# Patient Record
Sex: Female | Born: 1955 | Race: Black or African American | Hispanic: No | Marital: Married | Smoking: Never smoker
Health system: Southern US, Community
[De-identification: ages and names within clinical notes are randomized; demographics above are authoritative.]

## PROBLEM LIST (undated history)

## (undated) DIAGNOSIS — N289 Disorder of kidney and ureter, unspecified: Secondary | ICD-10-CM

## (undated) DIAGNOSIS — E7800 Pure hypercholesterolemia, unspecified: Secondary | ICD-10-CM

## (undated) DIAGNOSIS — E785 Hyperlipidemia, unspecified: Secondary | ICD-10-CM

## (undated) DIAGNOSIS — L409 Psoriasis, unspecified: Secondary | ICD-10-CM

## (undated) DIAGNOSIS — G8334 Monoplegia, unspecified affecting left nondominant side: Secondary | ICD-10-CM

## (undated) DIAGNOSIS — K589 Irritable bowel syndrome without diarrhea: Secondary | ICD-10-CM

## (undated) DIAGNOSIS — E559 Vitamin D deficiency, unspecified: Secondary | ICD-10-CM

## (undated) DIAGNOSIS — E538 Deficiency of other specified B group vitamins: Secondary | ICD-10-CM

## (undated) DIAGNOSIS — I10 Essential (primary) hypertension: Secondary | ICD-10-CM

## (undated) HISTORY — DX: Psoriasis, unspecified: L40.9

## (undated) HISTORY — DX: Irritable bowel syndrome, unspecified: K58.9

## (undated) HISTORY — DX: Hyperlipidemia, unspecified: E78.5

## (undated) HISTORY — DX: Pure hypercholesterolemia, unspecified: E78.00

## (undated) HISTORY — DX: Deficiency of other specified B group vitamins: E53.8

## (undated) HISTORY — DX: Vitamin D deficiency, unspecified: E55.9

## (undated) HISTORY — DX: Essential (primary) hypertension: I10

## (undated) HISTORY — DX: Monoplegia, unspecified affecting left nondominant side: G83.34

---

## 2021-04-01 ENCOUNTER — Other Ambulatory Visit: Payer: Self-pay | Admitting: Neurology

## 2021-04-01 DIAGNOSIS — R202 Paresthesia of skin: Secondary | ICD-10-CM

## 2021-04-08 ENCOUNTER — Other Ambulatory Visit: Payer: Self-pay | Admitting: Neurology

## 2021-04-08 DIAGNOSIS — R202 Paresthesia of skin: Secondary | ICD-10-CM

## 2021-04-28 ENCOUNTER — Ambulatory Visit
Admission: RE | Admit: 2021-04-28 | Discharge: 2021-04-28 | Disposition: A | Discharge status: Home / Self Care | Payer: Medicare PPO | Source: Ambulatory Visit | Attending: Neurology | Admitting: Neurology

## 2021-04-28 DIAGNOSIS — R202 Paresthesia of skin: Secondary | ICD-10-CM

## 2021-04-28 IMAGING — MR MR HEAD WO/W CM
12 series · 48 of 48 positions shown · IV contrast (multihance)
Comparison: Same day cervical spine MRI [DATE].

CLINICAL DATA: Provided history: Paresthesia. Additional history
provided by scanning technologist: Patient reports piercing
headaches, nausea, involuntary movement, "skin crawling" sensation,
head neck pain, left-sided numbness, weakness of entire body,
symptoms for 1 year.

EXAM:
MRI HEAD WITHOUT AND WITH CONTRAST
TECHNIQUE: Multiplanar, multiecho pulse sequences of the brain and surrounding
structures were obtained without and with intravenous contrast.
CONTRAST:  15mL MULTIHANCE GADOBENATE DIMEGLUMINE 529 MG/ML IV SOLN

[Series 2: T1 · sagittal · 5.0mm · 0.45mm/px · 1 of 25 slices shown]
[im 1/25]
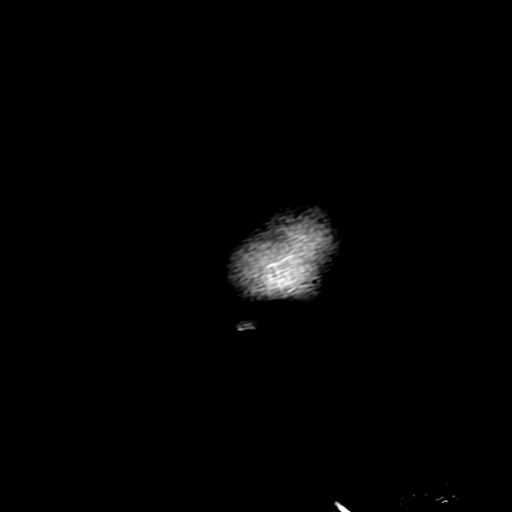

[Series 3: ax ep2d_diff_3 · axial · 3.0mm · 1.80mm/px · z∈[-99,+54]mm · 5 of 104 slices shown]
[im 1/104]
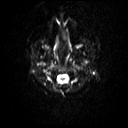
[im 26/104]
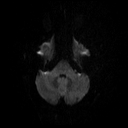
[im 52/104]
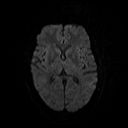
[im 78/104]
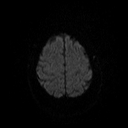
[im 104/104]
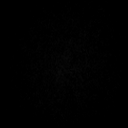

[Series 4: ax ep2d_diff_3_adc · axial · 3.0mm · 1.80mm/px · z∈[-99,+54]mm · 3 of 52 slices shown]
[im 1/52]
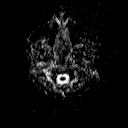
[im 26/52]
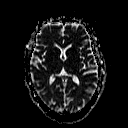
[im 52/52]
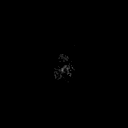

[Series 5: cor ep2d_diff · coronal · 5.0mm · 1.77mm/px · 4 of 60 slices shown]
[im 1/60]
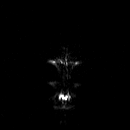
[im 20/60]
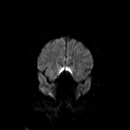
[im 40/60]
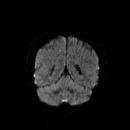
[im 60/60]
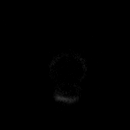

[Series 6: cor ep2d_diff_adc · coronal · 5.0mm · 1.77mm/px · 2 of 30 slices shown]
[im 1/30]
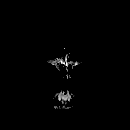
[im 30/30]
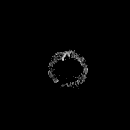

[Series 8: swi_images · axial · 2.0mm · 0.98mm/px · z∈[-102,+56]mm · 5 of 80 slices shown]
[im 1/80]
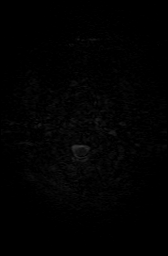
[im 20/80]
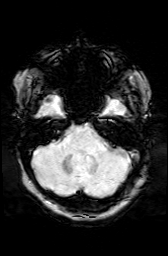
[im 40/80]
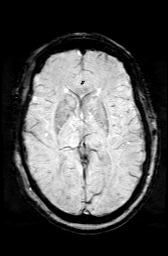
[im 60/80]
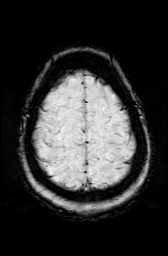
[im 80/80]
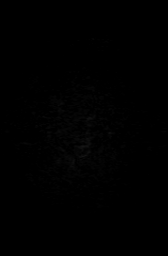

[Series 9: FLAIR · axial · 3.0mm · 0.43mm/px · z∈[-101,+51]mm · 2 of 40 slices shown]
[im 1/40]
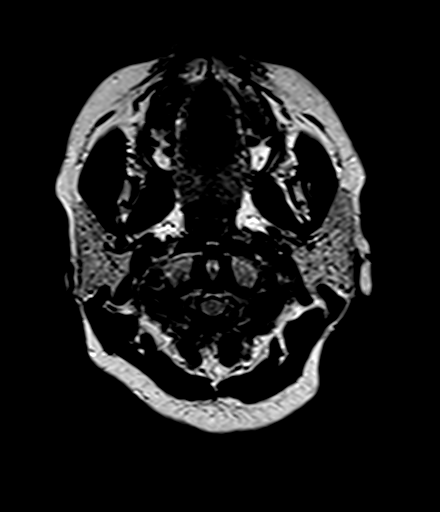
[im 40/40]
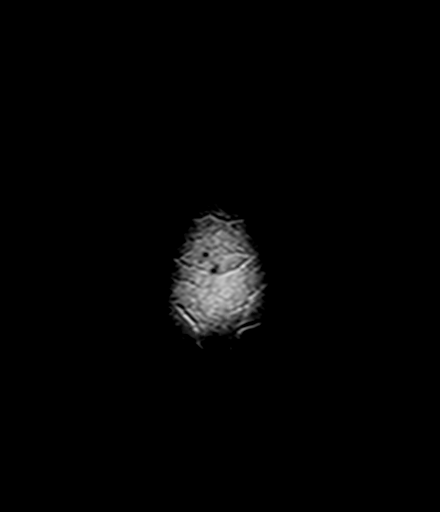

[Series 10: T2 · axial · 5.0mm · 0.65mm/px · z∈[-100,+50]mm · 2 of 26 slices shown (1 of 2)]
[im 1/26]
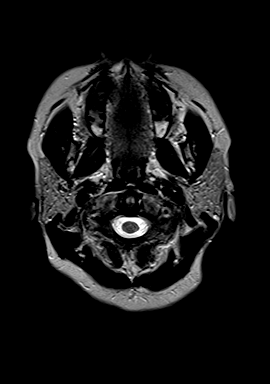
[im 26/26]
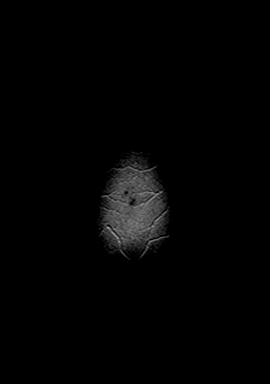

[Series 11: t1_mpr_tra · axial · 1.0mm · 0.72mm/px · z∈[-101,+58]mm · 10 of 160 slices shown]
[im 1/160]
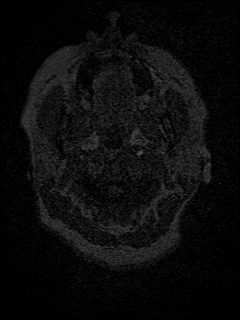
[im 18/160]
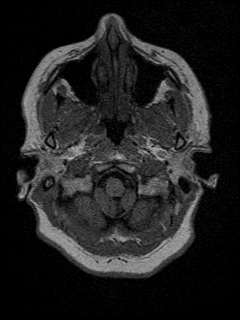
[im 36/160]
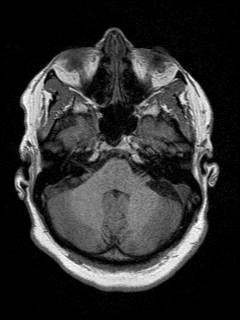
[im 54/160]
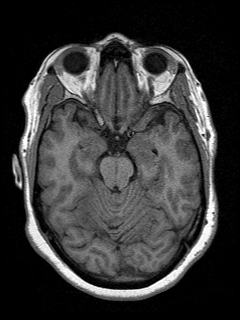
[im 71/160]
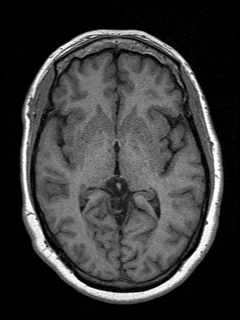
[im 89/160]
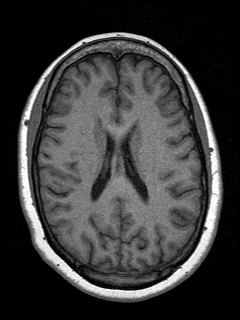
[im 107/160]
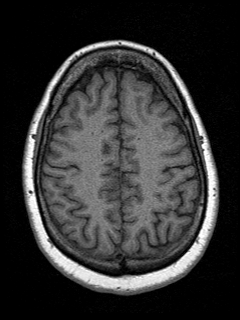
[im 124/160]
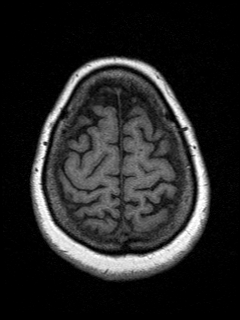
[im 142/160]
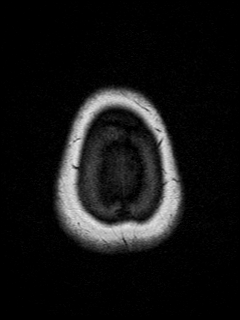
[im 160/160]
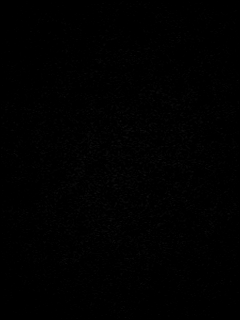

[Series 12: T2 · coronal · 5.0mm · 0.43mm/px · 2 of 31 slices shown (2 of 2)]
[im 1/31]
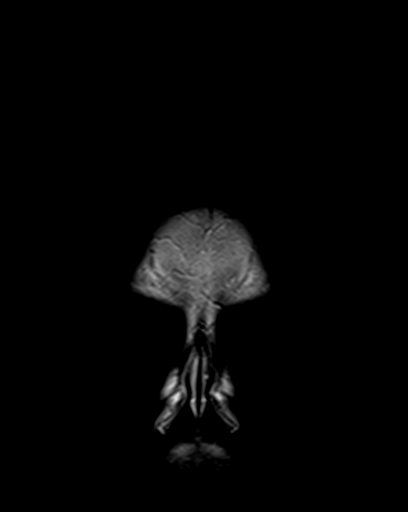
[im 31/31]
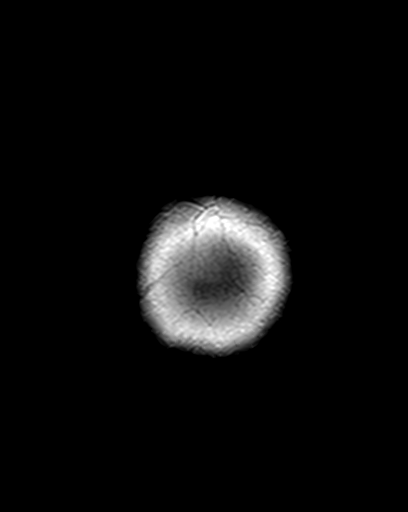

[Series 13: post t1_mpr_tra · axial · 1.0mm · 0.72mm/px · z∈[-101,+58]mm · 10 of 160 slices shown]
[im 1/160]
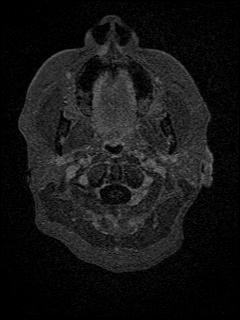
[im 18/160]
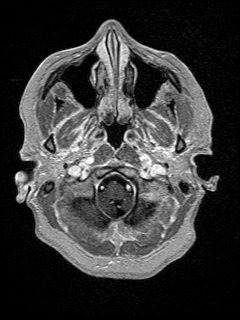
[im 36/160]
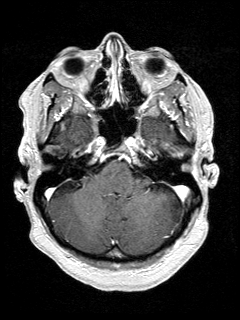
[im 54/160]
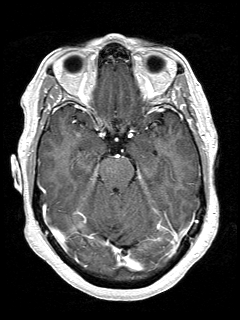
[im 71/160]
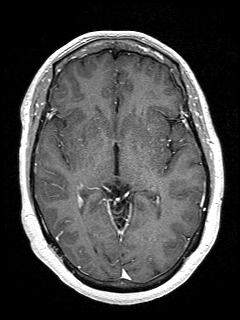
[im 89/160]
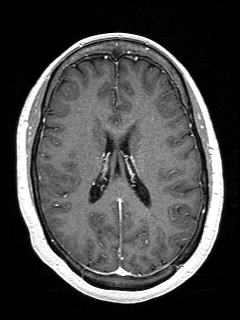
[im 107/160]
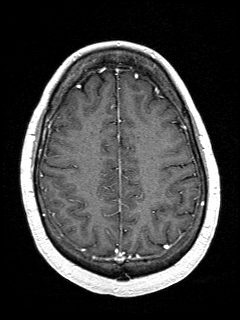
[im 124/160]
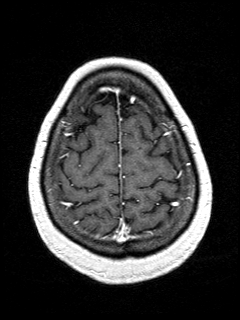
[im 142/160]
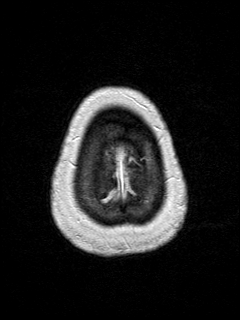
[im 160/160]
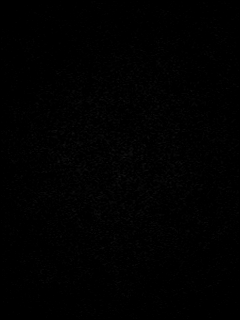

[Series 14: T1 post-contrast · coronal · 5.0mm · 0.72mm/px · 2 of 31 slices shown]
[im 1/31]
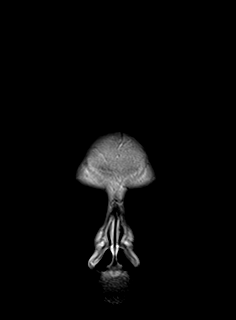
[im 31/31]
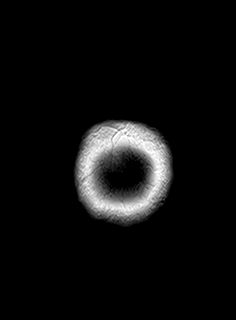

[48 of 48 positions shown; findings below may reference images not displayed]

FINDINGS: Brain:

Cerebral volume is normal.

There are a few scattered punctate foci of T2 FLAIR hyperintense
signal abnormality within the cerebral white matter, nonspecific but
likely reflecting minimal changes of chronic small vessel ischemia.

There is no acute infarct.

No evidence of an intracranial mass.

No chronic intracranial blood products.

No extra-axial fluid collection.

No midline shift.

No pathologic intracranial enhancement identified.

Vascular: Maintained flow voids within the proximal large arterial
vessels.

Skull and upper cervical spine: No focal suspicious marrow lesion.

Sinuses/Orbits: Visualized orbits show no acute finding. No
significant paranasal sinus disease.
IMPRESSION: No evidence of acute intracranial abnormality.

There are a few scattered punctate foci of T2 FLAIR hyperintense
signal abnormality within the cerebral white matter, nonspecific but
likely reflecting minimal changes of chronic small vessel ischemia.

Otherwise unremarkable MRI appearance of the brain.

## 2021-04-28 IMAGING — MR MR CERVICAL SPINE WO/W CM
6 of 8 series · 29 of 48 positions shown · IV contrast (15 ml Multihance)
Comparison: Same-day brain MRI [DATE].

CLINICAL DATA: Provided history: Paresthesia. Additional history
provided by scanning technologist: Patient reports piercing
headaches, nausea, involuntary movement, "skin crawling" sensation,
head and neck pain, left-sided numbness, weakness of entire body,
symptoms for 1 year.

EXAM:
MRI CERVICAL SPINE WITHOUT AND WITH CONTRAST
TECHNIQUE: Multiplanar and multiecho pulse sequences of the cervical spine, to
include the craniocervical junction and cervicothoracic junction,
were obtained without and with intravenous contrast.
CONTRAST:  15mL MULTIHANCE GADOBENATE DIMEGLUMINE 529 MG/ML IV SOLN

[Series 3: T1 · sagittal · 3.0mm · 0.41mm/px · 4 of 17 slices shown (1 of 2)]
[im 1/17]
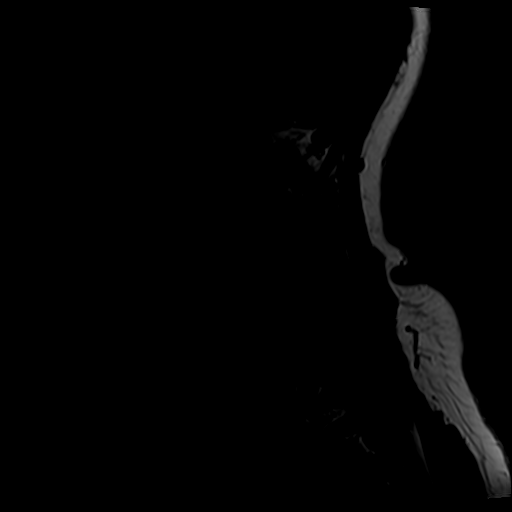
[im 6/17]
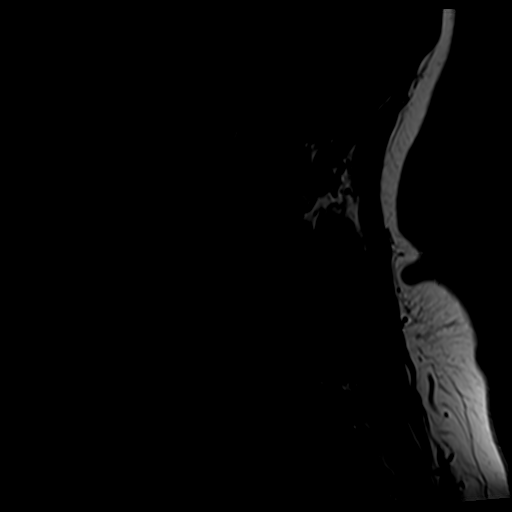
[im 11/17]
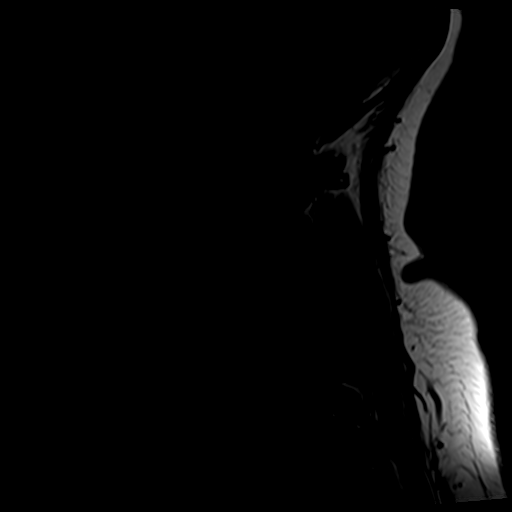
[im 17/17]
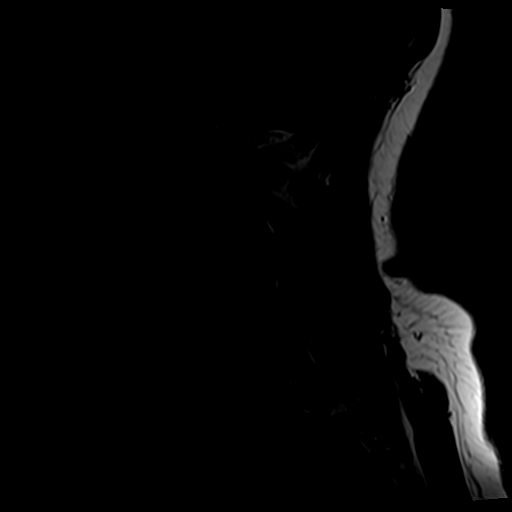

[Series 5: T2 · axial · 3.0mm · 0.70mm/px · z∈[-218,-99]mm · 8 of 33 slices shown (1 of 2)]
[im 1/33]
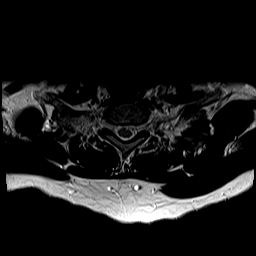
[im 5/33]
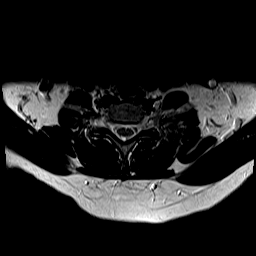
[im 10/33]
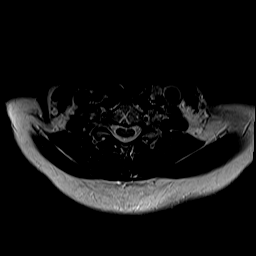
[im 14/33]
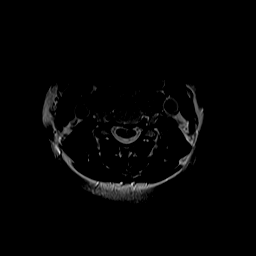
[im 19/33]
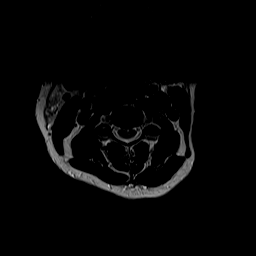
[im 23/33]
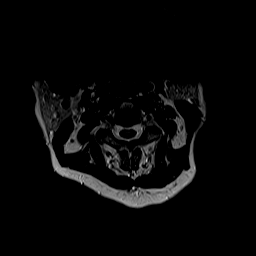
[im 28/33]
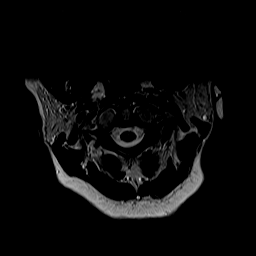
[im 33/33]
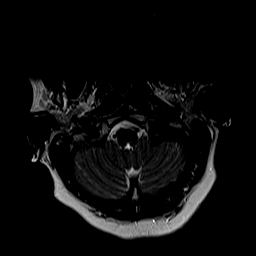

[Series 7: T1 · axial · non-contrast · 3.0mm · 0.35mm/px · z∈[-218,-99]mm · 8 of 33 slices shown (2 of 2)]
[im 1/33]
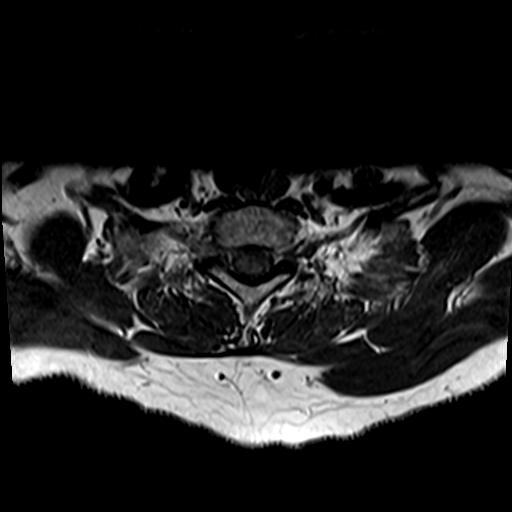
[im 5/33]
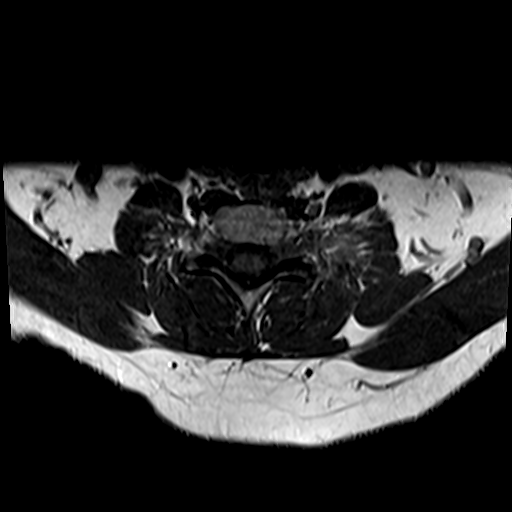
[im 10/33]
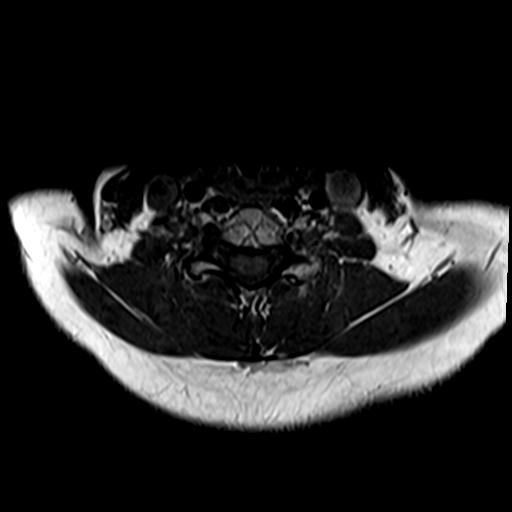
[im 14/33]
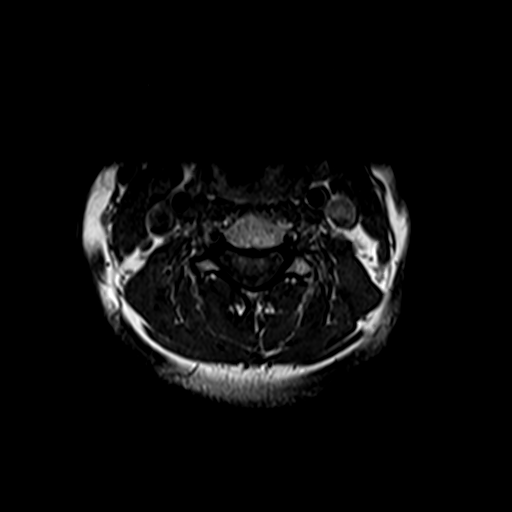
[im 19/33]
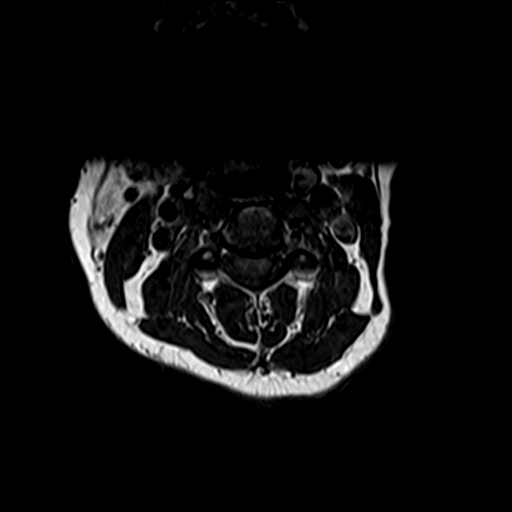
[im 23/33]
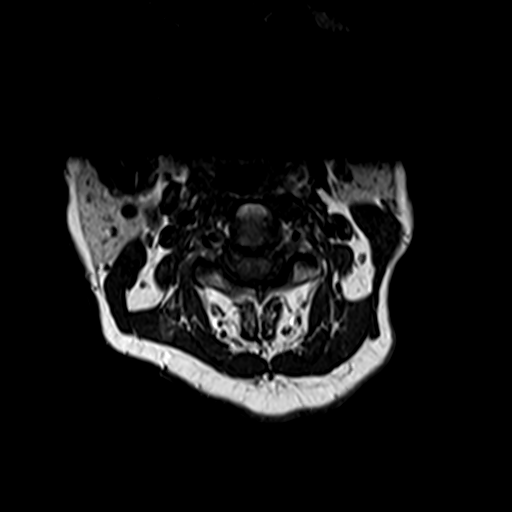
[im 28/33]
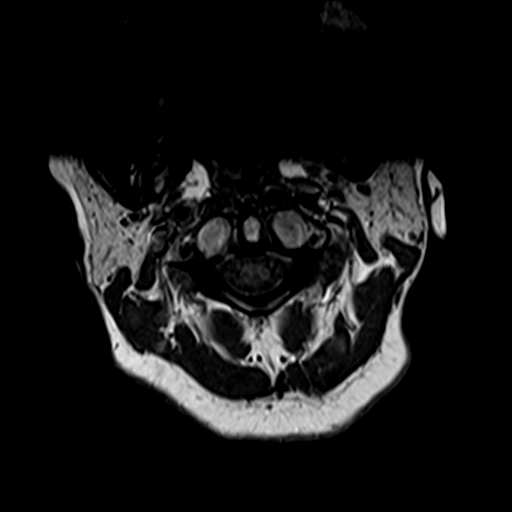
[im 33/33]
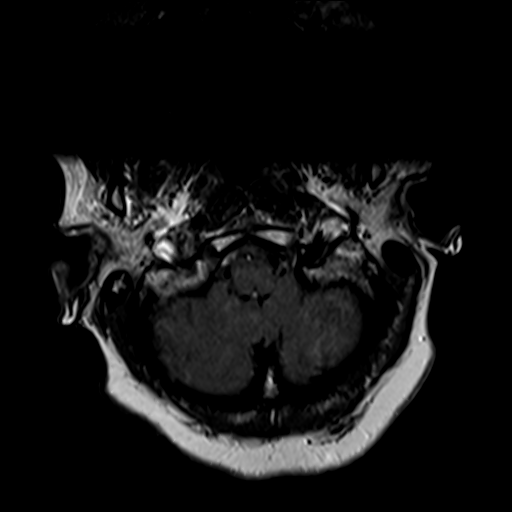

[Series 8: T2 · sagittal · 3.0mm · 0.82mm/px · 4 of 17 slices shown (2 of 2)]
[im 1/17]
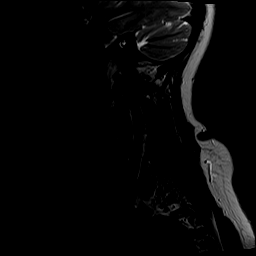
[im 6/17]
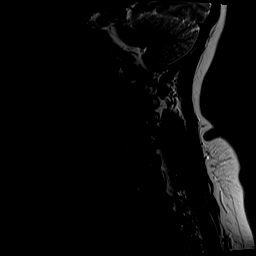
[im 11/17]
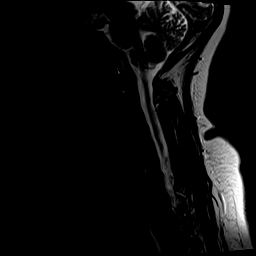
[im 17/17]
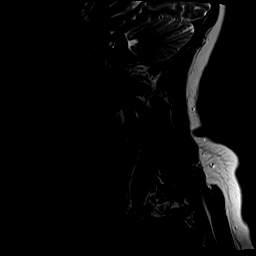

[Series 9: T1 fat-sat post-contrast · sagittal · 3.0mm · 0.82mm/px · 4 of 17 slices shown]
[im 1/17]
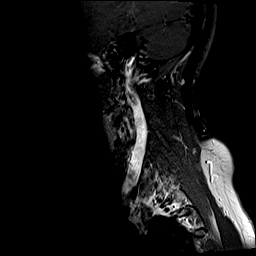
[im 6/17]
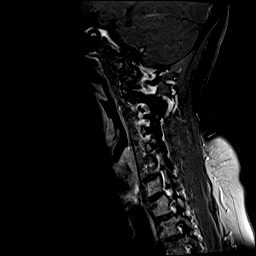
[im 11/17]
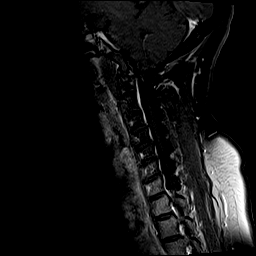
[im 17/17]
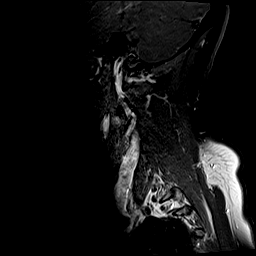

[Series 10: T1 post-contrast · axial · 3.0mm · 0.35mm/px · 1 of 33 slices shown]
[im 1/33]
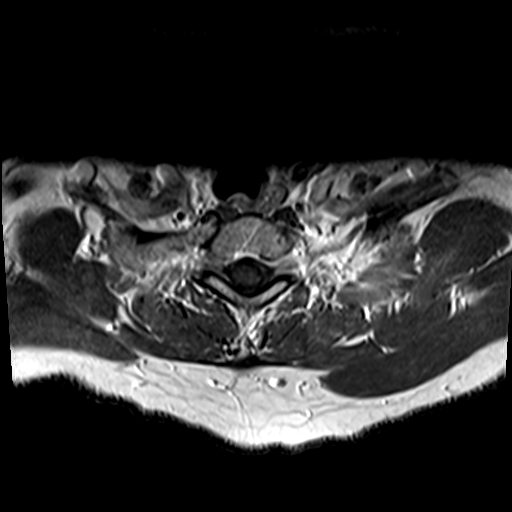

[29 of 48 positions shown; findings below may reference images not displayed]

FINDINGS: Alignment: Straightening of the expected cervical lordosis. No
significant spondylolisthesis.

Vertebrae: Vertebral body height is maintained. No significant
marrow edema or focal suspicious osseous lesion.

Cord: No signal abnormality identified within the cervical spinal
cord. No abnormal spinal cord enhancement.

Posterior Fossa, vertebral arteries, paraspinal tissues: Posterior
fossa better assessed on concurrently performed brain MRI. Flow
voids preserved within the imaged cervical vertebral arteries.
Paraspinal soft tissues unremarkable.

Disc levels:

Mild-to-moderate disc degeneration at C4-C5, C5-C6 and C6-C7. No
more than mild disc degeneration at the remaining levels.

Borderline congenitally narrow cervical spinal canal.

C2-C3: No significant disc herniation or stenosis.

C3-C4: Small central disc protrusion. The disc protrusion results in
mild focal effacement of the ventral thecal sac, and may contact the
ventral spinal cord. No significant foraminal stenosis.

C4-C5: Shallow disc bulge with superimposed small left center disc
protrusion. Minimal uncovertebral hypertrophy on the right. The disc
protrusion results in mild focal effacement of the left ventral
thecal sac, and may contact the ventral spinal cord. Minimal
relative right neural foraminal narrowing.

C5-C6: Shallow disc bulge with superimposed small center disc
protrusion. The disc protrusion focally effaces the ventral thecal
sac, contacting and minimally flattening the ventral aspect of the
spinal cord. However, the dorsal CSF space is maintained within the
spinal canal. No significant foraminal stenosis.

C6-C7: Disc bulge with superimposed small central disc protrusion.
The disc protrusion results in mild focal effacement of the ventral
thecal sac, and may contact the ventral spinal cord. No significant
foraminal stenosis.

C7-T1: No significant disc herniation or stenosis.
IMPRESSION: Cervical spondylosis, as outlined.

At C5-C6, a small central disc protrusion contacts and minimally
flattens the ventral aspect of the spinal cord. However, the dorsal
CSF space is maintained within the spinal canal.

No more than mild relative spinal canal narrowing at the remaining
levels. Minimal relative right neural foraminal narrowing at C4-C5.

Disc degeneration is greatest at C4-C5, C5-C6 and C6-C7
(mild-to-moderate at these levels).

Straightening of the expected cervical lordosis.

## 2021-04-28 MED ORDER — GADOBENATE DIMEGLUMINE 529 MG/ML IV SOLN
15.0000 mL | Freq: Once | INTRAVENOUS | Status: AC | PRN
  Administered 2021-04-28: 15 mL via INTRAVENOUS

## 2021-06-09 ENCOUNTER — Ambulatory Visit: Payer: Medicare PPO | Admitting: Neurology

## 2021-06-09 ENCOUNTER — Other Ambulatory Visit: Payer: Self-pay

## 2021-06-09 ENCOUNTER — Encounter: Payer: Self-pay | Admitting: Neurology

## 2021-06-09 VITALS — BP 122/82 | HR 70 | Ht 62.5 in | Wt 166.5 lb

## 2021-06-09 DIAGNOSIS — M5090 Cervical disc disorder, unspecified, unspecified cervical region: Secondary | ICD-10-CM

## 2021-06-09 DIAGNOSIS — R208 Other disturbances of skin sensation: Secondary | ICD-10-CM

## 2021-06-09 DIAGNOSIS — M79602 Pain in left arm: Secondary | ICD-10-CM

## 2021-06-09 DIAGNOSIS — R29898 Other symptoms and signs involving the musculoskeletal system: Secondary | ICD-10-CM | POA: Diagnosis not present

## 2021-06-09 NOTE — Progress Notes (Signed)
[facility]  PATIENT: Cathy Miranda DOB: March 23, 1956  REFERRING DOCTOR OR PCP: Lemar Lofty, MD SOURCE: Patient, notes from primary care, imaging reports, MRI images of the brain and spine personally reviewed.  _________________________________   HISTORICAL  CHIEF COMPLAINT:  Chief Complaint  Patient presents with   New Patient (Initial Visit)    Rm 2, alone. Paper referral from Lemar Lofty, MD for monoplegia of LUE. PT had MRI done on 12/12 (in Epic). Still needing NCV/EMG done.     HISTORY OF PRESENT ILLNESS:  I had the pleasure of seeing your patient, Cathy Miranda, at [facility] for neurologic consultation regarding her left arm weakness.  She is a 66 year old woman who has had left arm weakness over the last year.    She notes twitching in her hand/fingers and a crawling sensation in the arm.   She also feels her gait is off balanced and she veers to the left.    She stumbles some but no falls.     The onset of her symptoms was insidious over a period of time.    She  feels symptoms have slightly progressed.     She denies bladder changes.   No symptoms in legs.    She rarely has milder symptoms in right arm, face or torso.   Spells lasts a few minutes with the symptoms and they can occur day or night.  Sometimes she will have a pressure sensation in the head as well.  She is otherwise in good health.   She has psoriasis and is on [employer].   She has mild hypertension and mild hyperlipidemia and is trying to control with diet and exercise.     Studies reviewed: MRI of the brain 04/28/2021 shows just a couple punctate T2/FLAIR hyperintense foci in the cerebral hemispheres, normal for age.  Brain volume was normal.  There were no acute findings.  Normal enhancement pattern.  MRI of the cervical spine 04/28/2021 shows a normal spinal cord.  There are degenerative changes at C4-C5, C5-C6 and C6-C7 but no nerve root compression or spinal  stenosis.  LABS 05/06/2020 showed elevated TChol = 260, elevated LDL 189.  B12 was fine at 957.   HgbA1c was 5.7 02/25/20 showed normal BMP   REVIEW OF SYSTEMS: Constitutional: No fevers, chills, sweats, or change in appetite Eyes: No visual changes, double vision, eye pain Ear, nose and throat: No hearing loss, ear pain, nasal congestion, sore throat Cardiovascular: No chest pain, palpitations Respiratory:  No shortness of breath at rest or with exertion.   No wheezes GastrointestinaI: No nausea, vomiting, diarrhea, abdominal pain, fecal incontinence Genitourinary:  No dysuria, urinary retention or frequency.  No nocturia. Musculoskeletal:  No neck pain, back pain Integumentary: No rash, pruritus, skin lesions Neurological: as above Psychiatric: No depression at this time.  No anxiety Endocrine: No palpitations, diaphoresis, change in appetite, change in weigh or increased thirst Hematologic/Lymphatic:  No anemia, purpura, petechiae. Allergic/Immunologic: No itchy/runny eyes, nasal congestion, recent allergic reactions, rashes  ALLERGIES: Allergies  Allergen Reactions   Sulfa Antibiotics Nausea Only and Other (See Comments)    Other Reaction: GI Upset Other Reaction: GI Upset Other Reaction: GI Upset     HOME MEDICATIONS:  Current Outpatient Medications:    Calcium Carbonate-Vitamin D 500-3.125 MG-MCG TABS, Take 1 tablet by mouth every other day., Disp: , Rfl:    MULTIPLE VITAMINS-MINERALS PO, Take 1 tablet by mouth daily., Disp: , Rfl:    Risankizumab-rzaa (SKYRIZI, 150 MG  DOSE, Alexis), Inject into the skin every 3 (three) months., Disp: , Rfl:    Turmeric (QC TUMERIC COMPLEX PO), Take by mouth daily., Disp: , Rfl:   PAST MEDICAL HISTORY: Past Medical History:  Diagnosis Date   Cobalamin deficiency    Essential hypertension    Hypercholesteremia    Hyperlipemia    IBS (irritable colon syndrome)    Monoplegia affecting left nondominant side (HCC)    Psoriasis     Vitamin D deficiency     PAST SURGICAL HISTORY: Past Surgical History:  Procedure Laterality Date   CESAREAN SECTION     total of 2    FAMILY HISTORY: Family History  Problem Relation Age of Onset   Diabetes Mother    Lymphoma Mother    High blood pressure Mother    Pancreatic cancer Sister    High blood pressure Brother     SOCIAL HISTORY:  Social History   Socioeconomic History   Marital status: Married    Spouse name: Ethelene Browns   Number of children: 2   Years of education: Not on file   Highest education level: Master's degree (e.g., MA, MS, MEng, MEd, MSW, MBA)  Occupational History   Not on file  Tobacco Use   Smoking status: Never   Smokeless tobacco: Never  Substance and Sexual Activity   Alcohol use: Never   Drug use: Never   Sexual activity: Not on file  Other Topics Concern   Not on file  Social History Narrative   Lives with husband   Right handed   Caffeine: no soda, tea-couple a cups a week   Social Determinants of Health   Financial Resource Strain: Not on file  Food Insecurity: Not on file  Transportation Needs: Not on file  Physical Activity: Not on file  Stress: Not on file  Social Connections: Not on file  Intimate Partner Violence: Not on file     PHYSICAL EXAM  Vitals:   06/09/21 1008  BP: 122/82  Pulse: 70  SpO2: 99%  Weight: 166 lb 8 oz (75.5 kg)  Height: 5' 2.5" (1.588 m)    Body mass index is 29.97 kg/m.   General: The patient is well-developed and well-nourished and in no acute distress  HEENT:  Head is [place]/AT.  Sclera are anicteric.    Neck: No carotid bruits are noted.  The neck is nontender.  Cardiovascular: The heart has a regular rate and rhythm with a normal S1 and S2. There were no murmurs, gallops or rubs.    Skin: Extremities are without rash or  edema.  Musculoskeletal:  Back is nontender  Neurologic Exam  Mental status: The patient is alert and oriented x 3 at the time of the examination. The  patient has apparent normal recent and remote memory, with an apparently normal attention span and concentration ability.   Speech is normal.  Cranial nerves: Extraocular movements are full. Pupils are equal, round, and reactive to light and accomodation.  Visual fields are full.  Facial symmetry is present. There is good facial sensation to soft touch bilaterally.Facial strength is normal.  Trapezius and sternocleidomastoid strength is normal. No dysarthria is noted.  The tongue is midline, and the patient has symmetric elevation of the soft palate. No obvious hearing deficits are noted.  Motor:  Muscle bulk is normal.   Tone is normal. Strength is  5 / 5 in all 4 extremities.   Sensory: Sensory testing is intact to pinprick, soft touch and vibration sensation  in all 4 extremities.  Coordination: Cerebellar testing reveals good finger-nose-finger and heel-to-shin bilaterally.  Gait and station: Station is normal.   Gait is normal. Tandem gait is normal. Romberg is negative.   Reflexes: Deep tendon reflexes are symmetric and normal bilaterally.   Plantar responses are flexor.       ASSESSMENT AND PLAN  Left arm weakness - Plan: NCV with EMG(electromyography), CANCELED: NCV with EMG(electromyography)  Left arm pain - Plan: NCV with EMG(electromyography), CANCELED: NCV with EMG(electromyography)  Dysesthesia - Plan: NCV with EMG(electromyography), CANCELED: NCV with EMG(electromyography)  Cervical disc disease   In summary, Cathy Miranda is a 66 year old woman with intermittent left arm weakness and dysesthetic sensations in the left arm more than elsewhere.  The etiology is uncertain.  MRI of the brain was normal for age.  MRI of the cervical spine did show disc degenerative changes at C3-C4 through C6-C7 though there was no nerve root compression or significant spinal stenosis.  Symptoms are intermittent and not bothersome enough for a medication at this time.  If they worsen consider  gabapentin or lamotrigine.  We will check an NCV/EMG of the left arm and leg to assess for other causes of symptoms including mononeuropathy, polyneuropathy.  I will see her when she returns for that study.  She should call sooner if new or worsening neurologic symptoms.  Thank you for asking me to see Cathy Miranda.  We will forward the results of the NCV/EMG.  Please let me know if I can be of further assistance with her or other patients in the future.   Jenavi Beedle A. Epimenio FootSater, MD, [facility],FAAN 06/09/2021, 10:26 AM Certified in Neurology, Clinical Neurophysiology, Sleep Medicine and Neuroimaging  [facility] [address], [address] [place], [place] [address] [phone]

## 2021-06-18 ENCOUNTER — Other Ambulatory Visit: Payer: Self-pay

## 2021-06-18 ENCOUNTER — Ambulatory Visit (INDEPENDENT_AMBULATORY_CARE_PROVIDER_SITE_OTHER): Payer: Medicare PPO | Admitting: Neurology

## 2021-06-18 ENCOUNTER — Encounter: Payer: Medicare PPO | Admitting: Neurology

## 2021-06-18 DIAGNOSIS — R208 Other disturbances of skin sensation: Secondary | ICD-10-CM | POA: Diagnosis not present

## 2021-06-18 DIAGNOSIS — Z0289 Encounter for other administrative examinations: Secondary | ICD-10-CM

## 2021-06-18 DIAGNOSIS — M79602 Pain in left arm: Secondary | ICD-10-CM | POA: Insufficient documentation

## 2021-06-18 DIAGNOSIS — R29898 Other symptoms and signs involving the musculoskeletal system: Secondary | ICD-10-CM | POA: Diagnosis not present

## 2021-06-18 NOTE — Progress Notes (Signed)
Full Name: Neema Causer Gender: Female MRN #: [id number] Date of Birth: 10/15/1955    Visit Date: 06/18/2021 13:21 Age: 66 Years Examining Physician: Arlice Colt, MD  Referring Physician: Arlice Colt, MD Height: 5 feet 2 inch Patient History: 166lbs     History: Ms. Christianne Dolin is a 66 year old woman with left arm numbness, pain and weakness.  Over the past couple weeks, pain has improved some.  On exam, strength was symmetric.  DTRs were symmetric.  Nerve conduction studies: The left median, ulnar, peroneal and tibial motor responses had normal distal latencies, amplitudes and conduction velocities.  The F-wave latencies were normal.  The left median, ulnar, sural and superficial peroneal sensory responses had normal peak latencies and amplitudes.  Electromyography: Needle EMG of selected muscles of the left arm was performed.  Motor unit morphology and recruitment was normal.  There was no abnormal spontaneous activity.  Impression: This is a normal NCV/EMG study.  There is no evidence of polyneuropathy, focal neuropathy, myopathy or radiculopathy.  Adessa Primiano A. Felecia Shelling, MD, PhD, FAAN Certified in Neurology, Clinical Neurophysiology, Sleep Medicine, Pain Medicine and Neuroimaging Director, [facility] [address] [place], [place] [address] [phone]   Verbal informed consent was obtained from the patient, patient was informed of potential risk of procedure, including bruising, bleeding, hematoma formation, infection, muscle weakness, muscle pain, numbness, among others.        [place]    Nerve / Sites Muscle Latency Ref. Amplitude Ref. Rel Amp Segments Distance Velocity Ref. Area    ms ms mV mV %  cm m/s m/s mVms  L Median - APB     Wrist APB 3.6 ?4.4 9.6 ?4.0 100 Wrist - APB 7   31.5     Upper arm APB 7.6  9.5  98.2 Upper arm - Wrist 21 54 ?49 31.2  L Ulnar - ADM     Wrist ADM  3.0 ?3.3 11.6 ?6.0 100 Wrist - ADM 7   39.9     B.Elbow ADM 6.4  10.9  94.1 B.Elbow - Wrist 18 54 ?49 38.0     A.Elbow ADM 8.2  10.4  94.6 A.Elbow - B.Elbow 10 53 ?49 36.7  L Peroneal - EDB     Ankle EDB 3.6 ?6.5 7.0 ?2.0 100 Ankle - EDB 9   20.4     Fib head EDB 9.6  7.3  104 Fib head - Ankle 28 46 ?44 23.8     Pop fossa EDB 11.8  7.1  97.1 Pop fossa - Fib head 10 46 ?44 23.3         Pop fossa - Ankle      L Tibial - AH     Ankle AH 3.3 ?5.8 5.0 ?4.0 100 Ankle - AH 9   9.5     Pop fossa AH 12.0  3.5  71 Pop fossa - Ankle 40 46 ?41 12.8             SNC    Nerve / Sites Rec. Site Peak Lat Ref.  Amp Ref. Segments Distance Peak Diff Ref.    ms ms V V  cm ms ms  L Sural - Ankle (Calf)     Calf Ankle 3.7 ?4.4 18 ?6 Calf - Ankle 14    L Superficial peroneal - Ankle     Lat leg Ankle 3.3 ?4.4 8 ?6 Lat leg - Ankle  14    L Median, Ulnar - Transcarpal comparison     Median Palm Wrist 2.0 ?2.2 66 ?35 Median Palm - Wrist 8       Ulnar Palm Wrist 2.1 ?2.2 38 ?12 Ulnar Palm - Wrist 8          Median Palm - Ulnar Palm  -0.1 ?0.4  L Median - Orthodromic (Dig II, Mid palm)     Dig II Wrist 2.9 ?3.4 13 ?10 Dig II - Wrist 13    L Ulnar - Orthodromic, (Dig V, Mid palm)     Dig V Wrist 2.9 ?3.1 16 ?5 Dig V - Wrist 58                 F  Wave    Nerve F Lat Ref.   ms ms  L Tibial - AH 55.2 ?56.0  L Ulnar - ADM 27.0 ?32.0         EMG Summary Table    Spontaneous MUAP Recruitment  Muscle IA Fib PSW Fasc Other Amp Dur. Poly Pattern  L. Deltoid Normal None None None _______ Normal Normal Normal Normal  L. Triceps brachii Normal None None None _______ Normal Normal Normal Normal  L. Biceps brachii Normal None None None _______ Normal Normal Normal Normal  L. Extensor digitorum communis Normal None None None _______ Normal Normal Normal Normal  L. Abd. Pollicis Brevis Normal None None None _______ Normal Normal Normal Normal  L. First dorsal interosseous Normal None None None _______ Normal Normal  Normal Normal

## 2021-06-25 ENCOUNTER — Other Ambulatory Visit: Payer: Self-pay | Admitting: Hematology and Oncology

## 2021-06-25 ENCOUNTER — Other Ambulatory Visit: Payer: Self-pay | Admitting: Internal Medicine

## 2021-06-25 DIAGNOSIS — Z87448 Personal history of other diseases of urinary system: Secondary | ICD-10-CM

## 2021-07-04 ENCOUNTER — Other Ambulatory Visit: Payer: Self-pay | Admitting: Internal Medicine

## 2021-07-04 DIAGNOSIS — Z87448 Personal history of other diseases of urinary system: Secondary | ICD-10-CM

## 2021-07-09 ENCOUNTER — Ambulatory Visit
Admission: RE | Admit: 2021-07-09 | Discharge: 2021-07-09 | Disposition: A | Discharge status: Home / Self Care | Payer: Medicare PPO | Source: Ambulatory Visit | Attending: Internal Medicine | Admitting: Internal Medicine

## 2021-07-09 DIAGNOSIS — Z87448 Personal history of other diseases of urinary system: Secondary | ICD-10-CM

## 2021-07-09 IMAGING — US US ABDOMEN COMPLETE
1 series · 13 of 25 positions shown · non-contrast
Comparison: None.

CLINICAL DATA: History of cystic kidney disease.

EXAM:
ABDOMEN ULTRASOUND COMPLETE

[Series 1: us abdomen complete · 0.17mm/px · 13 of 152 slices shown]
[im 1/152]
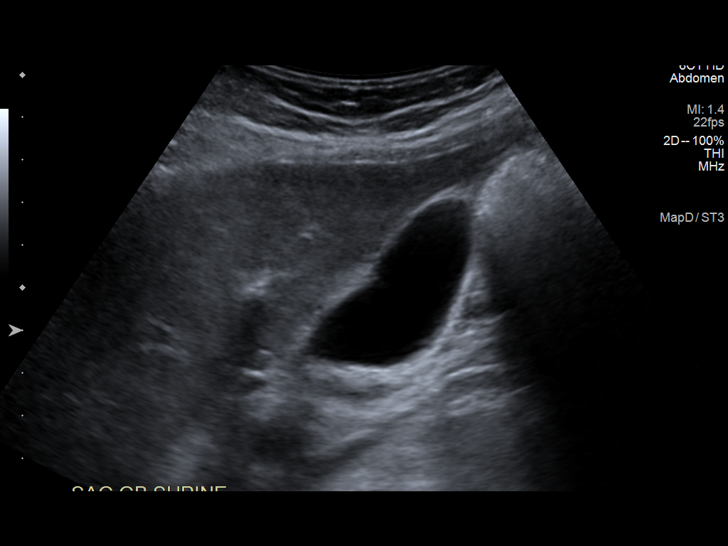
[im 13/152]
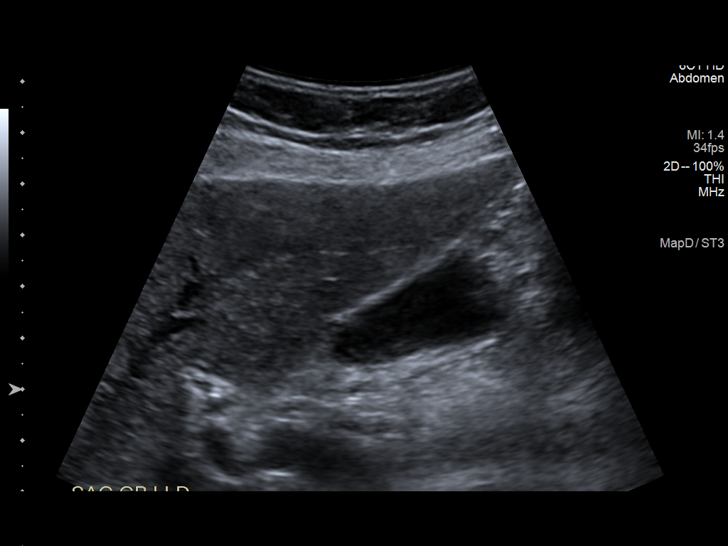
[im 26/152]
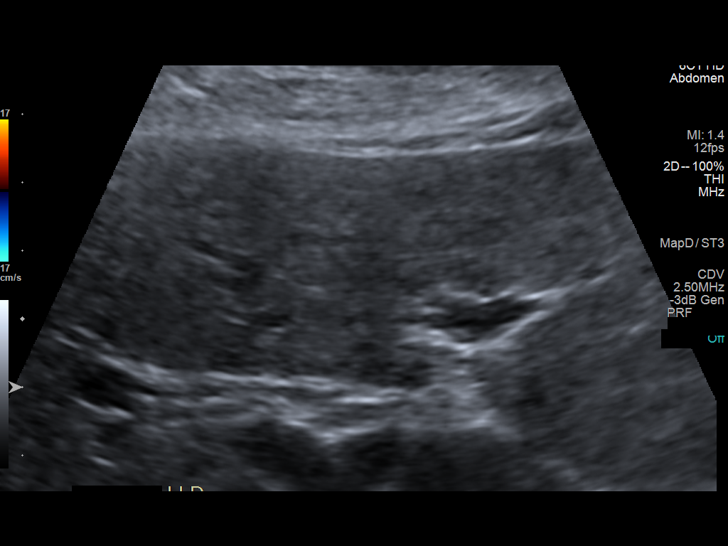
[im 38/152]
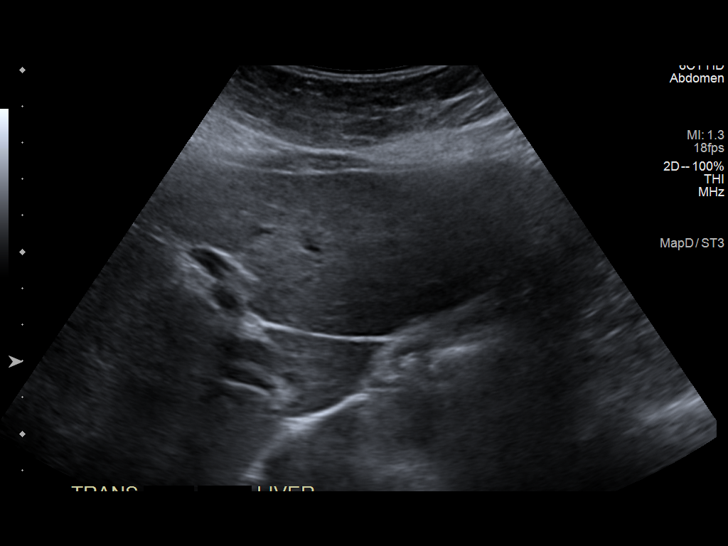
[im 51/152]
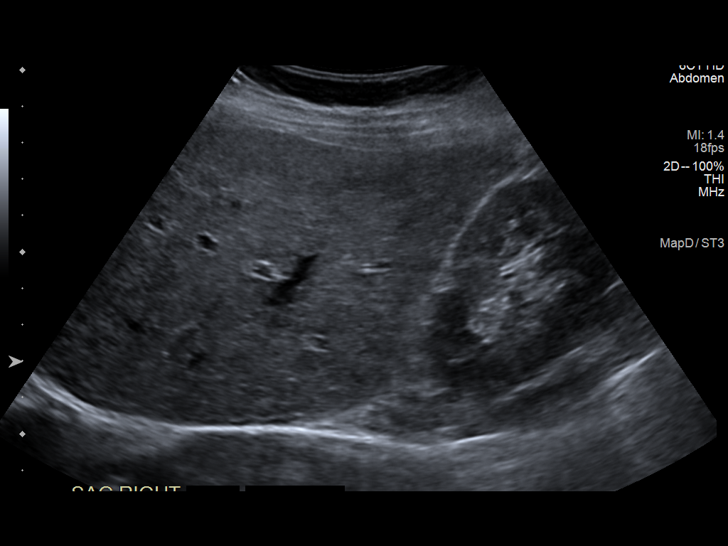
[im 63/152]
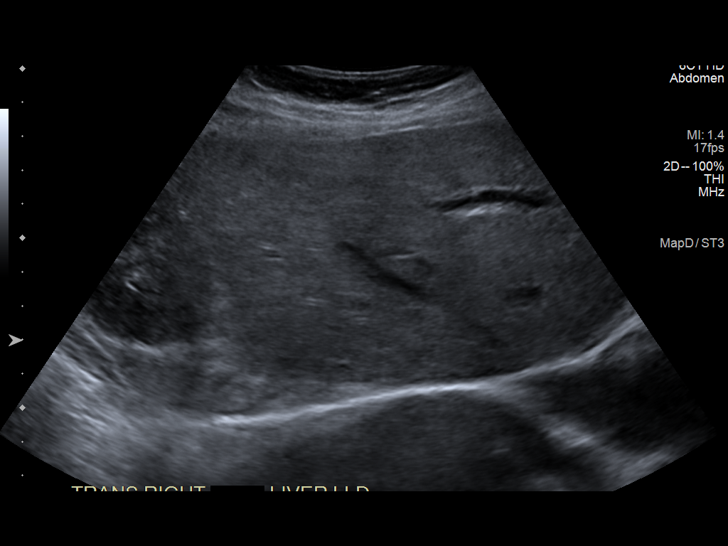
[im 76/152]
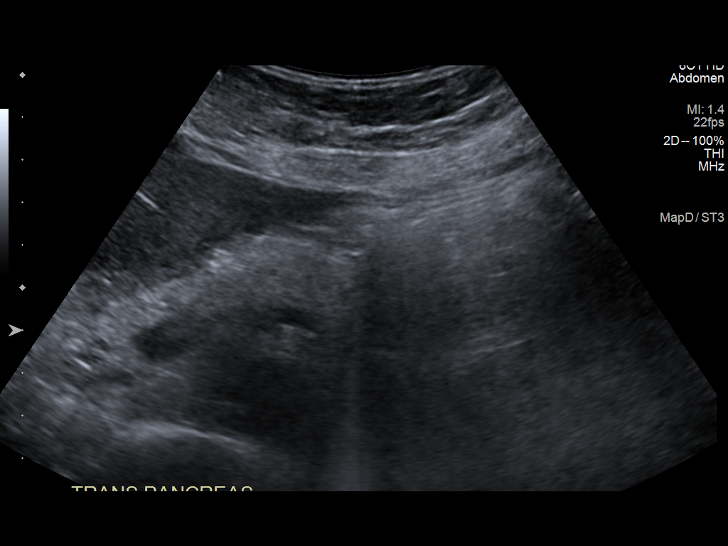
[im 89/152]
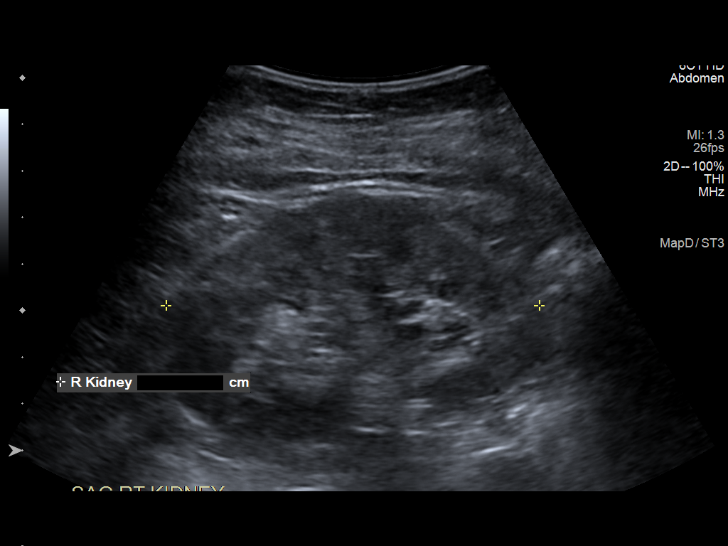
[im 101/152]
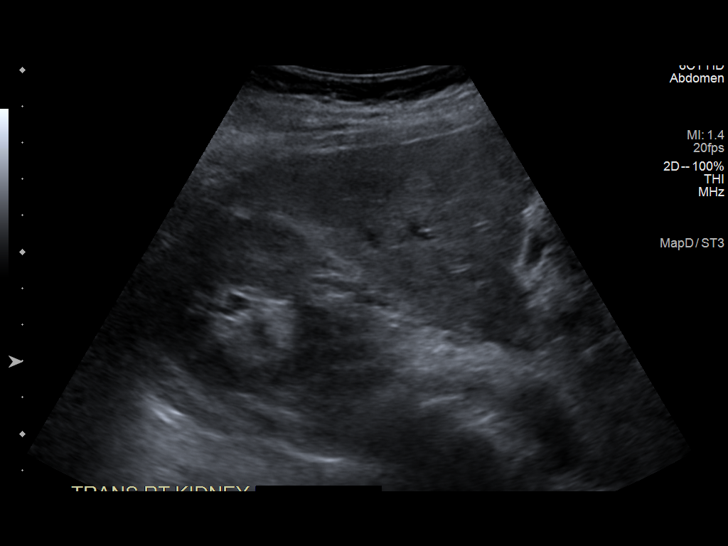
[im 114/152]
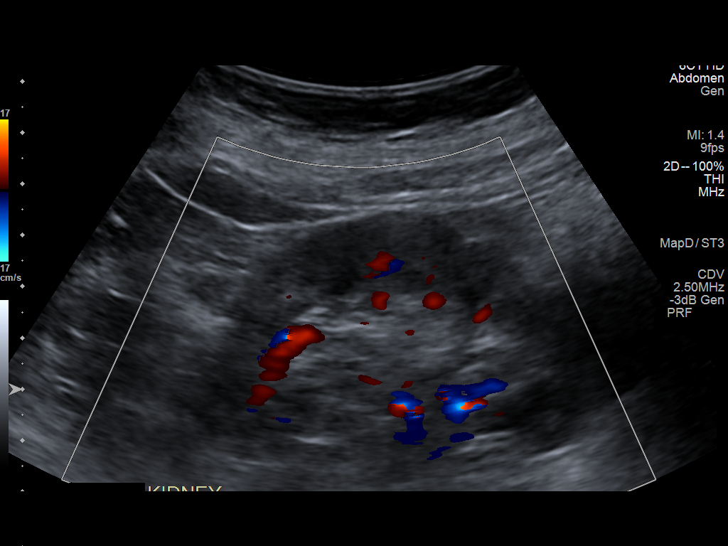
[im 126/152]
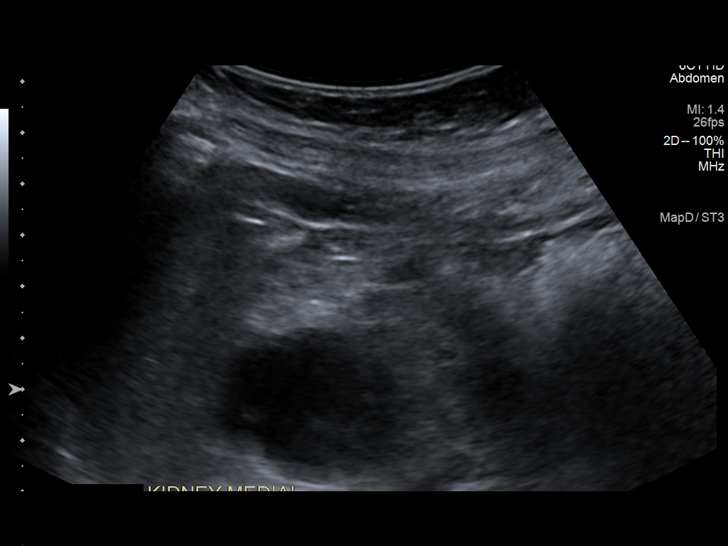
[im 139/152]
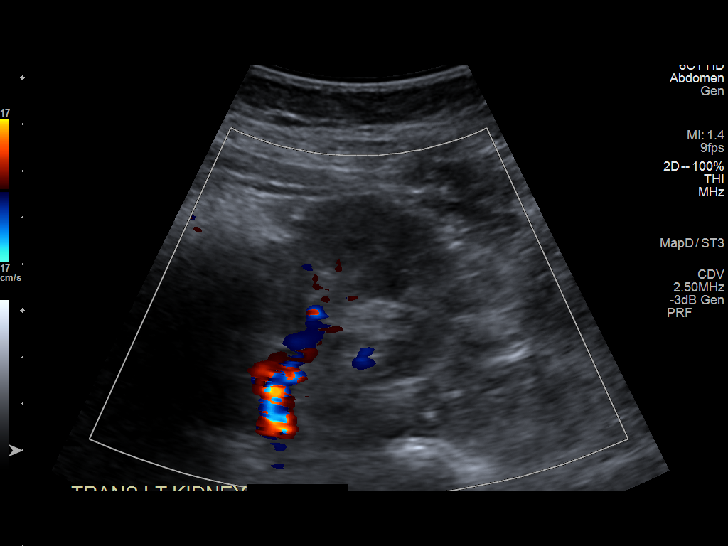
[im 152/152]
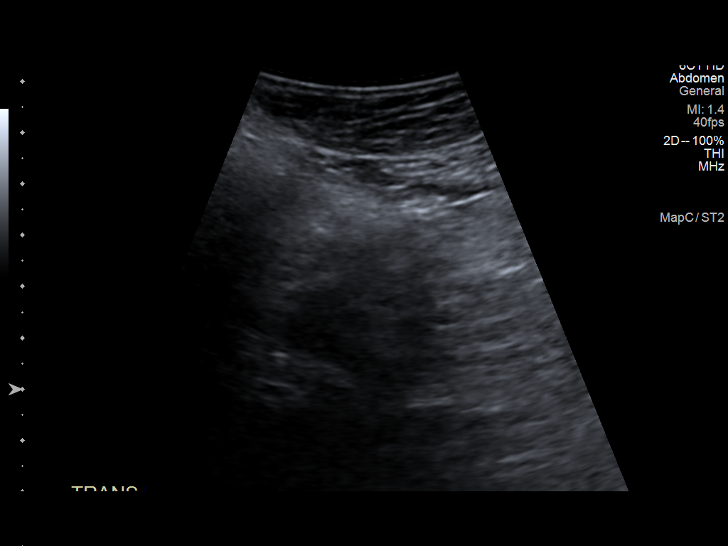

[13 of 25 positions shown; findings below may reference images not displayed]

FINDINGS: Gallbladder: No gallstones or wall thickening visualized (1.7 mm).
No sonographic Murphy sign noted by sonographer.

Common bile duct: Diameter: 2.1 mm

Liver: No focal lesion identified. The liver parenchyma is mildly
heterogeneous in appearance. Within normal limits in parenchymal
echogenicity. Portal vein is patent on color Doppler imaging with
normal direction of blood flow towards the liver.

IVC: No abnormality visualized.

Pancreas: Visualized portion unremarkable.

Spleen: Size (3.3 cm) and appearance within normal limits.

Right Kidney: Length: 8.0 cm. Echogenicity within normal limits. No
mass or hydronephrosis visualized.

Left Kidney: Length: 7.3 cm. Echogenicity within normal limits. A
3.3 cm x 3.1 cm x 2.6 cm anechoic structure is seen within upper
pole the left kidney. No abnormal flow is seen within this region on
color Doppler evaluation. No hydronephrosis is visualized.

Abdominal aorta: No aneurysm visualized (2.1 cm in AP diameter).

Other findings: None.
IMPRESSION: 1. Mildly heterogeneous appearance of the liver parenchyma without
focal liver lesions.
2. Large simple left renal cyst.

## 2021-09-30 NOTE — Progress Notes (Signed)
Office Visit Note  Patient: Cathy Miranda             Date of Birth: 02/28/1956           MRN: [id number]             PCP: Charlane Ferretti, MD Referring: Charlane Ferretti, MD Visit Date: 10/14/2021 Occupation: @GUAROCC @  Subjective:  Pain in multiple joints  History of Present Illness: Cathy Miranda is a 66 y.o. female seen in consultation per request of her PCP.  According to the patient her symptoms a started in her 6s with psoriasis.  She was diagnosed with psoriasis when she was in her 66s.  She used topical agents initially and then was a started on Humira.  She states she took Humira for 1 year but switched to Stelara due to inadequate response.  She took a Regulatory affairs officer for more than a year and then switch to his Tremfya due to inadequate response.  After taking Tremfya for 1 year she was switched with [employer].  She has been on Skyrizi for less than a year now.  She had good response to psoriasis.  She states her rash and her nails cleared up on Skyrizi.  For the last couple of years she has been experiencing joint stiffness.  She states the joint pain has been worse in the last 1 year.  She describes discomfort in her lower back, bilateral wrist, bilateral hands, hips, knees and her feet.  She has noticed intermittent swelling in her hands and her feet.  There is history of Planter fasciitis in the past.  She denies any history of Achilles tendinitis or uveitis.  There is no family history of psoriasis or psoriatic arthritis.  She is gravida 2, para 2.  There is no history of blood clots.  Activities of Daily Living:  Patient reports morning stiffness for 15-30 minutes.   Patient Reports nocturnal pain.  Difficulty dressing/grooming: Denies Difficulty climbing stairs: Denies Difficulty getting out of chair: Denies Difficulty using hands for taps, buttons, cutlery, and/or writing: Denies  Review of Systems  Constitutional:  Positive for fatigue.  HENT:  Negative for mouth sores, mouth dryness  and nose dryness.   Eyes:  Negative for pain, itching and dryness.  Respiratory:  Negative for shortness of breath and difficulty breathing.   Cardiovascular:  Negative for chest pain and palpitations.  Gastrointestinal:  Negative for blood in stool, constipation and diarrhea.  Endocrine: Negative for increased urination.  Genitourinary:  Negative for difficulty urinating.  Musculoskeletal:  Positive for joint pain, joint pain, joint swelling, myalgias, morning stiffness, muscle tenderness and myalgias.  Skin:  Negative for color change, rash, redness and sensitivity to sunlight.  Allergic/Immunologic: Negative for susceptible to infections.  Neurological:  Negative for dizziness, numbness, headaches, memory loss and weakness.  Hematological:  Positive for bruising/bleeding tendency.  Psychiatric/Behavioral:  Negative for confusion.    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[place])    Psoriasis    Vitamin D deficiency     Family History  Problem Relation Age of Onset   Diabetes Mother    Lymphoma Mother    High blood pressure Mother    Pancreatic cancer Sister    High blood pressure Brother    Healthy Son    Healthy Daughter    Past Surgical History:  Procedure Laterality Date   CESAREAN SECTION     total of 2   Social History   Social History Narrative   Lives with husband   Right handed   Caffeine: no soda, tea-couple a cups a week    Immunization History  Administered Date(s) Administered   [employer] Vaccination 07/15/2019, 08/12/2019     Objective: Vital Signs: BP (!) 144/82 (BP Location: Right Arm, Patient Position: Sitting, Cuff Size: Normal)   Pulse 60   Ht 5' 3.5" (1.613 m)   Wt 153 lb 3.2 oz (69.5 kg)   BMI 26.71 kg/m    Physical Exam Vitals and nursing note reviewed.  Constitutional:      Appearance: She is well-developed.  HENT:     Head: Normocephalic and atraumatic.  Eyes:     Conjunctiva/sclera: Conjunctivae normal.  Cardiovascular:     Rate and Rhythm: Normal rate and regular rhythm.     Heart sounds: Normal heart sounds.  Pulmonary:     Effort: Pulmonary effort is normal.     Breath sounds: Normal breath sounds.  Abdominal:     General: Bowel sounds are normal.     Palpations: Abdomen is soft.  Musculoskeletal:     Cervical back: Normal range of motion.  Lymphadenopathy:     Cervical: No cervical adenopathy.  Skin:    General: Skin is warm and dry.     Capillary Refill: Capillary refill takes less than 2 seconds.  Neurological:     Mental Status: She is alert and oriented to person, place, and time.  Psychiatric:        Behavior: Behavior normal.     Musculoskeletal Exam: C-spine was in good range of motion.  She discomfort range of motion of her lumbar spine.  Tenderness was noted over left SI joint.  Shoulder joints, elbow joints, wrist joints with good range of motion.  She had bilateral PIP and DIP thickening with no synovitis.  She had no MCP swelling.  She had tenderness over bilateral trochanteric bursa.  Hip joints with good range of motion.  Knee joints with good range of motion with discomfort.  There was no tenderness over ankles or MTPs.  She had no synovitis over MTP joints.  CDAI Exam: CDAI Score: -- Patient Global: --; Provider Global: -- Swollen: --; Tender: -- Joint Exam 10/14/2021   No joint exam has been documented for this visit   There is  currently no information documented on the homunculus. Go to the Rheumatology activity and complete the homunculus joint exam.  Investigation: No additional findings.  Imaging: No results found.  Recent Labs: No results found for: WBC, HGB, PLT, NA, K, CL, CO2, GLUCOSE, BUN, CREATININE, BILITOT, ALKPHOS, AST, ALT, PROT, ALBUMIN, CALCIUM, GFRAA, QFTBGOLD, QFTBGOLDPLUS  August 18, 2021 hemoglobin A1c 5.7, CBC normal, CMP normal  Speciality Comments: No specialty comments available.  Procedures:  No procedures performed Allergies: Sulfa antibiotics   Assessment / Plan:     Visit Diagnoses: Psoriasis -patient gives history of psoriasis for many years.  She failed topical agents, Humira, Stelara and Tremfya  in the past.  She has been on the [facility] for less than a year.  She states its cleared up her psoriasis.  She also noticed improvement in the nail dystrophy.  Skyrizi prescribed by [employer].  Polyarthralgia -she has been experiencing pain and discomfort in multiple joints over the last 2 years.  She states the discomfort and stiffness has been worsened over the last 1 year.  She notices intermittent swelling in her hands and her feet.  No synovitis was noted on the examination today.  High risk medication use - Skyrizi-prescribed by derm  Pain in both hands -she complains of pain and discomfort in her bilateral hands.  She gives history of intermittent swelling.  No synovitis was noted.  Bilateral PIP and DIP thickening was noted.  Plan: XR Hand 2 View Right, XR Hand 2 View Left.  X-rays obtained today were consistent with osteoarthritis.  Trochanteric bursitis of both hips-she had tenderness over bilateral trochanteric bursa consistent with trochanteric bursitis.  Chronic pain of both knees -she complains of discomfort in her knee joints.  No warmth swelling or effusion was noted.  Plan: XR KNEE 3 VIEW RIGHT, XR KNEE 3 VIEW LEFT.  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  Pain in both feet -she had planter fasciitis several years ago.  With no recurrence.  She complains of discomfort in her MTP joints.  No synovitis was noted.  There was no evidence of Planter fasciitis or Achilles tendinitis on the examination today.  Plan: XR Foot 2 Views Right, XR Foot 2 Views Left, x-rays were consistent with osteoarthritis.  Rheumatoid factor, Cyclic citrul peptide antibody, IgG, Uric acid, Sedimentation rate  DDD (degenerative disc disease), cervical-MRI of the cervical spine from December 2022 was consistent with degenerative disc disease.  Chronic midline low back pain without sciatica -she complains of lower back pain and the left SI joint pain.  Plan: XR Lumbar Spine 2-3 Views.  No SI joint to sclerosis or narrowing was noted.  Osteophytes were noted.  Facet joint arthropathy was noted.  Other medical problems are listed as follows:  Stage 3a chronic kidney disease ([place])  Prediabetes  Essential hypertension  Hypercholesteremia  Fatty liver  History of IBS  History of diverticulosis  Vitamin B12 deficiency  Orders: Orders Placed This Encounter  Procedures   XR Hand 2 View Right   XR Hand 2 View Left   XR Lumbar Spine 2-3 Views   XR Foot 2 Views Right   XR Foot 2 Views Left   XR KNEE 3 VIEW RIGHT   XR KNEE 3 VIEW LEFT   Rheumatoid factor   Cyclic citrul peptide antibody, IgG   Uric acid   Sedimentation rate   No orders of the defined types were placed in this encounter.   Follow-Up Instructions: Return for Arthralgia, psoriasis.   Bo Merino, MD  Note - This record has been created using Editor, commissioning.  Chart creation errors have been sought, but may not always  have been located. Such creation errors do not reflect on  the standard of medical care.

## 2021-10-10 ENCOUNTER — Ambulatory Visit: Payer: Medicare PPO | Admitting: Rheumatology

## 2021-10-14 ENCOUNTER — Ambulatory Visit (INDEPENDENT_AMBULATORY_CARE_PROVIDER_SITE_OTHER): Payer: Medicare PPO

## 2021-10-14 ENCOUNTER — Ambulatory Visit: Payer: Medicare PPO | Admitting: Rheumatology

## 2021-10-14 ENCOUNTER — Encounter: Payer: Self-pay | Admitting: Rheumatology

## 2021-10-14 VITALS — BP 144/82 | HR 60 | Ht 63.5 in | Wt 153.2 lb

## 2021-10-14 DIAGNOSIS — M25562 Pain in left knee: Secondary | ICD-10-CM

## 2021-10-14 DIAGNOSIS — M79642 Pain in left hand: Secondary | ICD-10-CM

## 2021-10-14 DIAGNOSIS — M5459 Other low back pain: Secondary | ICD-10-CM

## 2021-10-14 DIAGNOSIS — M7062 Trochanteric bursitis, left hip: Secondary | ICD-10-CM

## 2021-10-14 DIAGNOSIS — M79671 Pain in right foot: Secondary | ICD-10-CM

## 2021-10-14 DIAGNOSIS — M79672 Pain in left foot: Secondary | ICD-10-CM

## 2021-10-14 DIAGNOSIS — I10 Essential (primary) hypertension: Secondary | ICD-10-CM

## 2021-10-14 DIAGNOSIS — L409 Psoriasis, unspecified: Secondary | ICD-10-CM | POA: Diagnosis not present

## 2021-10-14 DIAGNOSIS — Z79899 Other long term (current) drug therapy: Secondary | ICD-10-CM | POA: Diagnosis not present

## 2021-10-14 DIAGNOSIS — K760 Fatty (change of) liver, not elsewhere classified: Secondary | ICD-10-CM | POA: Insufficient documentation

## 2021-10-14 DIAGNOSIS — G8929 Other chronic pain: Secondary | ICD-10-CM

## 2021-10-14 DIAGNOSIS — M79641 Pain in right hand: Secondary | ICD-10-CM

## 2021-10-14 DIAGNOSIS — M25561 Pain in right knee: Secondary | ICD-10-CM

## 2021-10-14 DIAGNOSIS — M7061 Trochanteric bursitis, right hip: Secondary | ICD-10-CM

## 2021-10-14 DIAGNOSIS — M2550 Pain in unspecified joint: Secondary | ICD-10-CM | POA: Diagnosis not present

## 2021-10-14 DIAGNOSIS — R7303 Prediabetes: Secondary | ICD-10-CM | POA: Insufficient documentation

## 2021-10-14 DIAGNOSIS — M5030 Other cervical disc degeneration, unspecified cervical region: Secondary | ICD-10-CM

## 2021-10-14 DIAGNOSIS — M545 Low back pain, unspecified: Secondary | ICD-10-CM

## 2021-10-14 DIAGNOSIS — E538 Deficiency of other specified B group vitamins: Secondary | ICD-10-CM

## 2021-10-14 DIAGNOSIS — N1831 Chronic kidney disease, stage 3a: Secondary | ICD-10-CM

## 2021-10-14 DIAGNOSIS — Z8719 Personal history of other diseases of the digestive system: Secondary | ICD-10-CM

## 2021-10-14 DIAGNOSIS — E7800 Pure hypercholesterolemia, unspecified: Secondary | ICD-10-CM

## 2021-10-16 LAB — CYCLIC CITRUL PEPTIDE ANTIBODY, IGG: Cyclic Citrullin Peptide Ab: <16 UNITS

## 2021-10-16 LAB — RHEUMATOID FACTOR: Rheumatoid fact SerPl-aCnc: <14 IU/mL (ref <14)

## 2021-10-16 LAB — SEDIMENTATION RATE: Sed Rate: 6 mm/h (ref 0–30)

## 2021-10-16 LAB — URIC ACID: Uric Acid, Serum: 4.7 mg/dL (ref 2.5–7.0)

## 2021-10-16 NOTE — Progress Notes (Signed)
Rheumatoid factor, anti-CCP, uric acid and sedimentation rate are within normal limits.  I will discuss results at the follow-up visit.

## 2021-10-20 DIAGNOSIS — L400 Psoriasis vulgaris: Secondary | ICD-10-CM | POA: Diagnosis not present

## 2021-10-26 NOTE — Progress Notes (Unsigned)
Office Visit Note  Patient: Cathy Miranda             Date of Birth: 03/20/1956           MRN: [id number]             PCP: Charlane Ferretti, MD Referring: Charlane Ferretti, MD Visit Date: 11/07/2021 Occupation: '@GUAROCC' @  Subjective:  No chief complaint on file.   History of Present Illness: Cathy Miranda is a 66 y.o. female ***   Activities of Daily Living:  Patient reports morning stiffness for *** {minute/hour:19697}.   Patient {ACTIONS;DENIES/REPORTS:21021675::"Denies"} nocturnal pain.  Difficulty dressing/grooming: {ACTIONS;DENIES/REPORTS:21021675::"Denies"} Difficulty climbing stairs: {ACTIONS;DENIES/REPORTS:21021675::"Denies"} Difficulty getting out of chair: {ACTIONS;DENIES/REPORTS:21021675::"Denies"} Difficulty using hands for taps, buttons, cutlery, and/or writing: {ACTIONS;DENIES/REPORTS:21021675::"Denies"}  No Rheumatology ROS completed.   PMFS History:  Patient Active Problem List   Diagnosis Date Noted   History of IBS 10/14/2021   Fatty liver 10/14/2021   History of diverticulosis 10/14/2021   Hypercholesteremia 10/14/2021   Essential hypertension 10/14/2021   Prediabetes 10/14/2021   Psoriasis 10/14/2021   Left arm pain 06/18/2021   Left arm weakness 06/18/2021   Dysesthesia 06/18/2021    Past Medical History:  Diagnosis Date   Cobalamin deficiency    Essential hypertension    Hypercholesteremia    Hyperlipemia    IBS (irritable colon syndrome)    Monoplegia affecting left nondominant side ([facility])    Psoriasis    Vitamin D deficiency     Family History  Problem Relation Age of Onset   Diabetes Mother    Lymphoma Mother    High blood pressure Mother    Pancreatic cancer Sister    High blood pressure Brother    Healthy Son    Healthy Daughter    Past Surgical History:  Procedure Laterality Date   CESAREAN SECTION     total of 2   Social History   Social History Narrative   Lives with husband   Right handed   Caffeine: no soda, tea-couple a  cups a week   Immunization History  Administered Date(s) Administered   [employer] Sars-Covid-2 Vaccination 07/15/2019, 08/12/2019     Objective: Vital Signs: There were no vitals taken for this visit.   Physical Exam   Musculoskeletal Exam: ***  CDAI Exam: CDAI Score: -- Patient Global: --; Provider Global: -- Swollen: --; Tender: -- Joint Exam 11/07/2021   No joint exam has been documented for this visit   There is currently no information documented on the homunculus. Go to the Rheumatology activity and complete the homunculus joint exam.  Investigation: No additional findings.  Imaging: XR Hand 2 View Right  Result Date: 10/14/2021 CMC, PIP and DIP narrowing was noted.  No MCP, intercarpal or radiocarpal joint space narrowing was noted.  No erosive changes were noted. Impression: These findings are consistent with osteoarthritis of the hand.  XR Hand 2 View Left  Result Date: 10/14/2021 CMC, PIP and DIP narrowing was noted.  No MCP, intercarpal or radiocarpal joint space narrowing was noted.  No erosive changes were noted. Impression: These findings are consistent with osteoarthritis of the hand.  XR KNEE 3 VIEW LEFT  Result Date: 10/14/2021 Moderate medial compartment narrowing was noted.  Moderate patellofemoral narrowing was noted.  No chondrocalcinosis was noted. 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  XR KNEE 3 VIEW RIGHT  Result Date: 10/14/2021 Moderate medial compartment narrowing was noted.  Mild patellofemoral narrowing was noted.  No chondrocalcinosis was noted. Impression: These findings are consistent with moderate osteoarthritis and mild chondromalacia patella.  XR Foot 2 Views Left  Result Date: 10/14/2021 First MTP narrowing and spurring was noted.  PIP and DIP narrowing was noted.  No intertarsal, tibiotalar or subtalar joint space narrowing was noted.  Inferior calcaneal  spur was noted.  No erosive changes were noted. Impression: These findings are consistent with osteoarthritis of the foot.  XR Foot 2 Views Right  Result Date: 10/14/2021 PIP and DIP narrowing was noted.  No MTP, intertarsal, tibiotalar or subtalar joint space narrowing was noted.  Inferior calcaneal spur was noted.  No erosive changes were noted. Impression: These findings are consistent with osteoarthritis of the foot.  XR Lumbar Spine 2-3 Views  Result Date: 10/14/2021 No SI joint narrowing was noted.  Lateral osteophytes were noted.  No significant disc space narrowing was noted.  Facet joint arthropathy was noted. Impression: These findings are consistent with degenerative changes and facet joint arthropathy.   Recent Labs: No results found for: "WBC", "HGB", "PLT", "NA", "K", "CL", "CO2", "GLUCOSE", "BUN", "CREATININE", "BILITOT", "ALKPHOS", "AST", "ALT", "PROT", "ALBUMIN", "CALCIUM", "GFRAA", "QFTBGOLD", "QFTBGOLDPLUS"  Oct 14, 2021 RF negative, anti-CCP negative, uric acid 4.7, ESR 6   Speciality Comments: No specialty comments available.  Procedures:  No procedures performed Allergies: Sulfa antibiotics   Assessment / Plan:     Visit Diagnoses: No diagnosis found.  Orders: No orders of the defined types were placed in this encounter.  No orders of the defined types were placed in this encounter.   Face-to-face time spent with patient was *** minutes. Greater than 50% of time was spent in counseling and coordination of care.  Follow-Up Instructions: No follow-ups on file.   Bo Merino, MD  Note - This record has been created using Editor, commissioning.  Chart creation errors have been sought, but may not always  have been located. Such creation errors do not reflect on  the standard of medical care.

## 2021-11-07 ENCOUNTER — Encounter: Payer: Self-pay | Admitting: Rheumatology

## 2021-11-07 ENCOUNTER — Ambulatory Visit: Payer: Medicare PPO | Admitting: Rheumatology

## 2021-11-07 VITALS — BP 158/76 | HR 71 | Resp 16 | Ht 62.5 in | Wt 152.0 lb

## 2021-11-07 DIAGNOSIS — M5030 Other cervical disc degeneration, unspecified cervical region: Secondary | ICD-10-CM

## 2021-11-07 DIAGNOSIS — I10 Essential (primary) hypertension: Secondary | ICD-10-CM

## 2021-11-07 DIAGNOSIS — M7061 Trochanteric bursitis, right hip: Secondary | ICD-10-CM | POA: Diagnosis not present

## 2021-11-07 DIAGNOSIS — M51369 Other intervertebral disc degeneration, lumbar region without mention of lumbar back pain or lower extremity pain: Secondary | ICD-10-CM

## 2021-11-07 DIAGNOSIS — L409 Psoriasis, unspecified: Secondary | ICD-10-CM

## 2021-11-07 DIAGNOSIS — M170 Bilateral primary osteoarthritis of knee: Secondary | ICD-10-CM

## 2021-11-07 DIAGNOSIS — M19042 Primary osteoarthritis, left hand: Secondary | ICD-10-CM

## 2021-11-07 DIAGNOSIS — M7062 Trochanteric bursitis, left hip: Secondary | ICD-10-CM

## 2021-11-07 DIAGNOSIS — M5136 Other intervertebral disc degeneration, lumbar region: Secondary | ICD-10-CM | POA: Diagnosis not present

## 2021-11-07 DIAGNOSIS — M19041 Primary osteoarthritis, right hand: Secondary | ICD-10-CM

## 2021-11-07 DIAGNOSIS — Z8719 Personal history of other diseases of the digestive system: Secondary | ICD-10-CM

## 2021-11-07 DIAGNOSIS — M19071 Primary osteoarthritis, right ankle and foot: Secondary | ICD-10-CM | POA: Insufficient documentation

## 2021-11-07 DIAGNOSIS — K760 Fatty (change of) liver, not elsewhere classified: Secondary | ICD-10-CM

## 2021-11-07 DIAGNOSIS — M2550 Pain in unspecified joint: Secondary | ICD-10-CM

## 2021-11-07 DIAGNOSIS — N1831 Chronic kidney disease, stage 3a: Secondary | ICD-10-CM

## 2021-11-07 DIAGNOSIS — E538 Deficiency of other specified B group vitamins: Secondary | ICD-10-CM

## 2021-11-07 DIAGNOSIS — M19072 Primary osteoarthritis, left ankle and foot: Secondary | ICD-10-CM

## 2021-11-07 DIAGNOSIS — E7800 Pure hypercholesterolemia, unspecified: Secondary | ICD-10-CM

## 2021-11-07 DIAGNOSIS — Z79899 Other long term (current) drug therapy: Secondary | ICD-10-CM | POA: Diagnosis not present

## 2021-11-07 DIAGNOSIS — R7303 Prediabetes: Secondary | ICD-10-CM

## 2021-12-01 DIAGNOSIS — Z1231 Encounter for screening mammogram for malignant neoplasm of breast: Secondary | ICD-10-CM | POA: Diagnosis not present

## 2022-03-02 ENCOUNTER — Other Ambulatory Visit: Payer: Self-pay | Admitting: Internal Medicine

## 2022-03-02 DIAGNOSIS — N1831 Chronic kidney disease, stage 3a: Secondary | ICD-10-CM | POA: Diagnosis not present

## 2022-03-02 DIAGNOSIS — R7303 Prediabetes: Secondary | ICD-10-CM | POA: Diagnosis not present

## 2022-03-02 DIAGNOSIS — K581 Irritable bowel syndrome with constipation: Secondary | ICD-10-CM | POA: Diagnosis not present

## 2022-03-02 DIAGNOSIS — K760 Fatty (change of) liver, not elsewhere classified: Secondary | ICD-10-CM | POA: Diagnosis not present

## 2022-03-02 DIAGNOSIS — Z124 Encounter for screening for malignant neoplasm of cervix: Secondary | ICD-10-CM | POA: Diagnosis not present

## 2022-03-02 DIAGNOSIS — E559 Vitamin D deficiency, unspecified: Secondary | ICD-10-CM | POA: Diagnosis not present

## 2022-03-02 DIAGNOSIS — R221 Localized swelling, mass and lump, neck: Secondary | ICD-10-CM

## 2022-03-02 DIAGNOSIS — Z1331 Encounter for screening for depression: Secondary | ICD-10-CM | POA: Diagnosis not present

## 2022-03-02 DIAGNOSIS — E7800 Pure hypercholesterolemia, unspecified: Secondary | ICD-10-CM | POA: Diagnosis not present

## 2022-03-02 DIAGNOSIS — E538 Deficiency of other specified B group vitamins: Secondary | ICD-10-CM | POA: Diagnosis not present

## 2022-03-02 DIAGNOSIS — M8589 Other specified disorders of bone density and structure, multiple sites: Secondary | ICD-10-CM | POA: Diagnosis not present

## 2022-03-02 DIAGNOSIS — L409 Psoriasis, unspecified: Secondary | ICD-10-CM | POA: Diagnosis not present

## 2022-03-02 DIAGNOSIS — Z0000 Encounter for general adult medical examination without abnormal findings: Secondary | ICD-10-CM | POA: Diagnosis not present

## 2022-03-03 NOTE — Progress Notes (Signed)
Office Visit Note  Patient: Cathy Miranda             Date of Birth: [birth year]           MRN: [id number]             PCP: Thana Ates, MD Referring: Thana Ates, MD Visit Date: 03/17/2022 Occupation: @GUAROCC @  Subjective:  Pain in joints  History of Present Illness: Cathy Miranda is a 66 y.o. female with history of polyarthralgia and psoriasis.  She decided to come off of Skyrizi in September 2023.  She has not noticed any recurrence of psoriasis.  She believes her psoriasis was related to stress.  She denies any increased joint swelling.  She continues to have discomfort in her bilateral knee joints and her feet.  She has neck and lower back pain.  Activities of Daily Living:  Patient reports morning stiffness for 15 minutes.   Patient Denies nocturnal pain.  Difficulty dressing/grooming: Denies Difficulty climbing stairs: Denies Difficulty getting out of chair: Denies Difficulty using hands for taps, buttons, cutlery, and/or writing: Denies  Review of Systems  Constitutional:  Positive for fatigue.  HENT:  Positive for mouth dryness. Negative for mouth sores.   Eyes:  Negative for dryness.  Respiratory:  Negative for shortness of breath.   Cardiovascular:  Negative for chest pain and palpitations.  Gastrointestinal:  Negative for blood in stool, constipation and diarrhea.  Endocrine: Negative for increased urination.  Genitourinary:  Negative for involuntary urination.  Musculoskeletal:  Positive for joint pain, joint pain and morning stiffness. Negative for gait problem, joint swelling, myalgias, muscle weakness, muscle tenderness and myalgias.  Skin:  Negative for color change, rash, hair loss and sensitivity to sunlight.  Allergic/Immunologic: Negative for susceptible to infections.  Neurological:  Negative for dizziness and headaches.  Hematological:  Positive for swollen glands.  Psychiatric/Behavioral:  Negative for depressed mood and sleep disturbance. The patient  is not nervous/anxious.     PMFS History:  Patient Active Problem List   Diagnosis Date Noted   Primary osteoarthritis of both hands 11/07/2021   Primary osteoarthritis of both knees 11/07/2021   Primary osteoarthritis of both feet 11/07/2021   DDD (degenerative disc disease), cervical 11/07/2021   DDD (degenerative disc disease), lumbar 11/07/2021   History of IBS 10/14/2021   Fatty liver 10/14/2021   History of diverticulosis 10/14/2021   Hypercholesteremia 10/14/2021   Essential hypertension 10/14/2021   Prediabetes 10/14/2021   Psoriasis 10/14/2021   Left arm pain 06/18/2021   Left arm weakness 06/18/2021   Dysesthesia 06/18/2021    Past Medical History:  Diagnosis Date   Cobalamin deficiency    Essential hypertension    Hypercholesteremia    Hyperlipemia    IBS (irritable colon syndrome)    Monoplegia affecting left nondominant side (HCC)    Psoriasis    Vitamin D deficiency     Family History  Problem Relation Age of Onset   Diabetes Mother    Lymphoma Mother    High blood pressure Mother    Pancreatic cancer Sister    High blood pressure Brother    Healthy Daughter    Healthy Son    Past Surgical History:  Procedure Laterality Date   CESAREAN SECTION     total of 2   Social History   Social History Narrative   Lives with husband   Right handed   Caffeine: no soda, tea-couple a cups a week   Immunization History  Administered Date(s)  Administered   Moderna Sars-Covid-2 Vaccination 07/15/2019, 08/12/2019     Objective: Vital Signs: BP 132/86 (BP Location: Left Arm, Patient Position: Sitting, Cuff Size: Normal)   Pulse 63   Resp 14   Ht 5\' 2"  (1.575 m)   Wt 152 lb 3.2 oz (69 kg)   BMI 27.84 kg/m    Physical Exam Vitals and nursing note reviewed.  Constitutional:      Appearance: She is well-developed.  HENT:     Head: Normocephalic and atraumatic.  Eyes:     Conjunctiva/sclera: Conjunctivae normal.  Cardiovascular:     Rate and Rhythm:  Normal rate and regular rhythm.     Heart sounds: Normal heart sounds.  Pulmonary:     Effort: Pulmonary effort is normal.     Breath sounds: Normal breath sounds.  Abdominal:     General: Bowel sounds are normal.     Palpations: Abdomen is soft.  Musculoskeletal:     Cervical back: Normal range of motion.  Lymphadenopathy:     Cervical: No cervical adenopathy.  Skin:    General: Skin is warm and dry.     Capillary Refill: Capillary refill takes less than 2 seconds.  Neurological:     Mental Status: She is alert and oriented to person, place, and time.  Psychiatric:        Behavior: Behavior normal.      Musculoskeletal Exam: Cervical, thoracic and lumbar spine were in good range of motion.  She had no tenderness over SI joints.  She had tenderness over bilateral trochanteric bursa.  Shoulder joints, elbow joints, wrist joints, MCPs PIPs and DIPs been good range of motion with no synovitis.  Hip joints and knee joints in good range of motion.  There was no tenderness over ankles or MTPs.  CDAI Exam: CDAI Score: -- Patient Global: --; Provider Global: -- Swollen: --; Tender: -- Joint Exam 03/17/2022   No joint exam has been documented for this visit   There is currently no information documented on the homunculus. Go to the Rheumatology activity and complete the homunculus joint exam.  Investigation: No additional findings.  Imaging: CT CARDIAC SCORING (SELF PAY ONLY)  Addendum Date: 03/09/2022   ADDENDUM REPORT: 03/09/2022 09:28 EXAM: OVER-READ INTERPRETATION  CT CHEST The following report is a limited chest CT over-read performed by radiologist Dr. Lindaann Slough [facility] on 03/09/2022. This over-read does not include interpretation of cardiac or coronary anatomy or pathology. The coronary calcium score interpretation by the cardiologist is attached. COMPARISON:  None. FINDINGS: Mediastinum/Nodes: No enlarged lymph nodes within the visualized  mediastinum.There are small calcified right hilar and mediastinal lymph nodes consistent with prior granulomatous disease. Lungs/Pleura: There is no pleural effusion. The visualized lungs appear clear. Upper abdomen: No significant findings in the visualized upper abdomen. Musculoskeletal/Chest wall: No chest wall mass or suspicious osseous findings within the visualized chest. IMPRESSION: 1. No significant extracardiac findings within the visualized lower chest. 2. Sequela of prior granulomatous disease. Electronically Signed   By: Richardean Sale M.D.   On: 03/09/2022 09:28   Result Date: 03/09/2022 CLINICAL DATA:  Risk stratification EXAM: Coronary Calcium Score TECHNIQUE: The patient was scanned on a Siemens Somatom go.Top Scanner. Axial non-contrast 3 mm slices were carried out through the heart. The data set was analyzed on a dedicated work station and scored using the [place]. FINDINGS: Non-cardiac: See separate report from [facility]. Ascending Aorta: Normal size, mild aortic root calcifications Pericardium: Normal Coronary arteries: Normal origin  of left and right coronary arteries. Distribution of arterial calcifications if present, as noted below; LM 0 LAD 0 LCx 0 RCA 0 Total 0 IMPRESSION AND RECOMMENDATION: 1. Normal coronary calcium score of 0. Patient is low risk for coronary events. 2. CAC 0, CAC-DRS A0. 3. Continue heart healthy lifestyle and risk factor modification. Electronically Signed: By: Debbe Odea M.D. On: 03/06/2022 15:54   US THYROID  Result Date: 03/06/2022 CLINICAL DATA:  66 year old female with neck nodule EXAM: THYROID ULTRASOUND TECHNIQUE: Ultrasound examination of the thyroid gland and adjacent soft tissues was performed. COMPARISON:  None Available. FINDINGS: Parenchymal Echotexture: Mildly heterogenous Isthmus: 0.4 cm Right lobe: 5.0 cm x 1.5 cm x 1.7 cm Left lobe: 4.5 cm x 1.0 cm x 1.6 cm _________________________________________________________  Estimated total number of nodules >/= 1 cm: 0 Number of spongiform nodules >/=  2 cm not described below (TR1): 0 Number of mixed cystic and solid nodules >/= 1.5 cm not described below (TR2): 0 _________________________________________________________ Small nodules of the thyroid, none of which meet criteria for biopsy or surveillance, as designated by the newly established ACR TI-RADS criteria. Recommendations follow those established by the new ACR TI-RADS criteria (J Am Coll Radiol 2017;14:587-595). No adenopathy IMPRESSION: Heterogeneous appearance of the thyroid, as above, may indicate medical thyroid disease. Electronically Signed   By: Gilmer Mor D.O.   On: 03/06/2022 11:55    Recent Labs: No results found for: "WBC", "HGB", "PLT", "NA", "K", "CL", "CO2", "GLUCOSE", "BUN", "CREATININE", "BILITOT", "ALKPHOS", "AST", "ALT", "PROT", "ALBUMIN", "CALCIUM", "GFRAA", "QFTBGOLD", "QFTBGOLDPLUS"  Speciality Comments: Humira, Stelara, Tremfya-inadequate response  Procedures:  No procedures performed Allergies: Sulfa antibiotics   Assessment / Plan:     Visit Diagnoses: Polyarthralgia - History of pain in multiple joints over the last 2 years with intermittent swelling in hands and feet.  No synovitis was noted on the examination.  All autoimmune work-up has been negative.  Primary osteoarthritis of both hands -she has been experiencing stiffness in her hands.  No synovitis was noted.  Clinical and radiographic findings are consistent with osteoarthritis.    Trochanteric bursitis of both hips-she has intermittent discomfort in the trochanteric region.  IT band stretches were emphasized.  Primary osteoarthritis of both knees -she continues to have some discomfort in her knee joints.  X-rays were consistent with bilateral moderate osteoarthritis and chondromalacia patella.  Lower extremity muscle strengthening exercises were discussed.  Primary osteoarthritis of both feet -she has stiffness in her  feet.  No synovitis was noted.  Clinical and radiographic findings were consistent with osteoarthritis.   DDD (degenerative disc disease), cervical - Noted on the MRI done in December 2022.  She continues to have neck stiffness.  DDD (degenerative disc disease), lumbar - History of chronic back pain and left SI joint pain.  X-ray showed no SI joint sclerosis.  Early degenerative changes and facet joint arthropathy was noted.  Core strengthening exercises were discussed.  Psoriasis-patient has not had a flare of psoriasis.  She has been on [employer] for over a month now.  I did explain to her that it may take a little longer to see if she develops a flare.  High risk medication use - Skyrizi prescribed by her dermatologist.  History of inadequate response to Humira Stelara and Tremfya.  She discontinued Skyrizi as she was concerned about long-term use of Biologics.  Other medical problems are listed as follows:  Stage 3a chronic kidney disease (HCC)  Prediabetes  Fatty liver  Essential hypertension  Hypercholesteremia  History of diverticulosis  History of IBS  Vitamin B12 deficiency  Orders: No orders of the defined types were placed in this encounter.  No orders of the defined types were placed in this encounter.   Follow-Up Instructions: Return in about 6 months (around 09/15/2022) for Osteoarthritis, Ps.   Pollyann Savoy, MD  Note - This record has been created using Animal nutritionist.  Chart creation errors have been sought, but may not always  have been located. Such creation errors do not reflect on  the standard of medical care.

## 2022-03-05 ENCOUNTER — Ambulatory Visit
Admission: RE | Admit: 2022-03-05 | Discharge: 2022-03-05 | Disposition: A | Discharge status: Home / Self Care | Payer: Medicare PPO | Source: Ambulatory Visit | Attending: Internal Medicine | Admitting: Internal Medicine

## 2022-03-05 ENCOUNTER — Ambulatory Visit: Payer: Medicare PPO | Admitting: Rheumatology

## 2022-03-05 ENCOUNTER — Other Ambulatory Visit: Payer: Self-pay | Admitting: Internal Medicine

## 2022-03-05 DIAGNOSIS — E041 Nontoxic single thyroid nodule: Secondary | ICD-10-CM | POA: Diagnosis not present

## 2022-03-05 DIAGNOSIS — E7800 Pure hypercholesterolemia, unspecified: Secondary | ICD-10-CM

## 2022-03-05 DIAGNOSIS — R221 Localized swelling, mass and lump, neck: Secondary | ICD-10-CM

## 2022-03-06 ENCOUNTER — Ambulatory Visit
Admission: RE | Admit: 2022-03-06 | Discharge: 2022-03-06 | Disposition: A | Discharge status: Home / Self Care | Payer: Medicare PPO | Source: Ambulatory Visit | Attending: Internal Medicine | Admitting: Internal Medicine

## 2022-03-06 ENCOUNTER — Other Ambulatory Visit: Payer: Self-pay | Admitting: Internal Medicine

## 2022-03-06 DIAGNOSIS — E7800 Pure hypercholesterolemia, unspecified: Secondary | ICD-10-CM | POA: Insufficient documentation

## 2022-03-06 DIAGNOSIS — M8589 Other specified disorders of bone density and structure, multiple sites: Secondary | ICD-10-CM

## 2022-03-13 DIAGNOSIS — M85851 Other specified disorders of bone density and structure, right thigh: Secondary | ICD-10-CM | POA: Diagnosis not present

## 2022-03-13 DIAGNOSIS — Z780 Asymptomatic menopausal state: Secondary | ICD-10-CM | POA: Diagnosis not present

## 2022-03-16 ENCOUNTER — Other Ambulatory Visit: Payer: Self-pay | Admitting: Internal Medicine

## 2022-03-16 DIAGNOSIS — M8589 Other specified disorders of bone density and structure, multiple sites: Secondary | ICD-10-CM

## 2022-03-17 ENCOUNTER — Ambulatory Visit: Payer: Medicare PPO | Attending: Rheumatology | Admitting: Rheumatology

## 2022-03-17 ENCOUNTER — Encounter: Payer: Self-pay | Admitting: Rheumatology

## 2022-03-17 VITALS — BP 132/86 | HR 63 | Resp 14 | Ht 62.0 in | Wt 152.2 lb

## 2022-03-17 DIAGNOSIS — Z8719 Personal history of other diseases of the digestive system: Secondary | ICD-10-CM

## 2022-03-17 DIAGNOSIS — M5136 Other intervertebral disc degeneration, lumbar region: Secondary | ICD-10-CM | POA: Diagnosis not present

## 2022-03-17 DIAGNOSIS — E538 Deficiency of other specified B group vitamins: Secondary | ICD-10-CM

## 2022-03-17 DIAGNOSIS — M19071 Primary osteoarthritis, right ankle and foot: Secondary | ICD-10-CM | POA: Diagnosis not present

## 2022-03-17 DIAGNOSIS — M19042 Primary osteoarthritis, left hand: Secondary | ICD-10-CM

## 2022-03-17 DIAGNOSIS — M2550 Pain in unspecified joint: Secondary | ICD-10-CM

## 2022-03-17 DIAGNOSIS — M7061 Trochanteric bursitis, right hip: Secondary | ICD-10-CM

## 2022-03-17 DIAGNOSIS — I10 Essential (primary) hypertension: Secondary | ICD-10-CM

## 2022-03-17 DIAGNOSIS — E7800 Pure hypercholesterolemia, unspecified: Secondary | ICD-10-CM

## 2022-03-17 DIAGNOSIS — R7303 Prediabetes: Secondary | ICD-10-CM

## 2022-03-17 DIAGNOSIS — M19041 Primary osteoarthritis, right hand: Secondary | ICD-10-CM | POA: Diagnosis not present

## 2022-03-17 DIAGNOSIS — Z79899 Other long term (current) drug therapy: Secondary | ICD-10-CM

## 2022-03-17 DIAGNOSIS — M7062 Trochanteric bursitis, left hip: Secondary | ICD-10-CM

## 2022-03-17 DIAGNOSIS — M170 Bilateral primary osteoarthritis of knee: Secondary | ICD-10-CM

## 2022-03-17 DIAGNOSIS — L409 Psoriasis, unspecified: Secondary | ICD-10-CM

## 2022-03-17 DIAGNOSIS — M5030 Other cervical disc degeneration, unspecified cervical region: Secondary | ICD-10-CM

## 2022-03-17 DIAGNOSIS — N1831 Chronic kidney disease, stage 3a: Secondary | ICD-10-CM

## 2022-03-17 DIAGNOSIS — M19072 Primary osteoarthritis, left ankle and foot: Secondary | ICD-10-CM

## 2022-03-17 DIAGNOSIS — K760 Fatty (change of) liver, not elsewhere classified: Secondary | ICD-10-CM

## 2022-03-26 ENCOUNTER — Ambulatory Visit: Payer: Medicare PPO | Admitting: Rheumatology

## 2022-04-08 DIAGNOSIS — H2513 Age-related nuclear cataract, bilateral: Secondary | ICD-10-CM | POA: Diagnosis not present

## 2022-04-08 DIAGNOSIS — Z0100 Encounter for examination of eyes and vision without abnormal findings: Secondary | ICD-10-CM | POA: Diagnosis not present

## 2022-04-22 ENCOUNTER — Other Ambulatory Visit: Payer: Self-pay | Admitting: Unknown Physician Specialty

## 2022-04-22 DIAGNOSIS — R0989 Other specified symptoms and signs involving the circulatory and respiratory systems: Secondary | ICD-10-CM | POA: Diagnosis not present

## 2022-04-22 DIAGNOSIS — R1319 Other dysphagia: Secondary | ICD-10-CM

## 2022-04-22 DIAGNOSIS — K219 Gastro-esophageal reflux disease without esophagitis: Secondary | ICD-10-CM | POA: Diagnosis not present

## 2022-04-22 DIAGNOSIS — J9589 Other postprocedural complications and disorders of respiratory system, not elsewhere classified: Secondary | ICD-10-CM | POA: Diagnosis not present

## 2022-05-01 ENCOUNTER — Ambulatory Visit
Admission: RE | Admit: 2022-05-01 | Discharge: 2022-05-01 | Disposition: A | Discharge status: Home / Self Care | Payer: Medicare PPO | Source: Ambulatory Visit | Attending: Unknown Physician Specialty | Admitting: Unknown Physician Specialty

## 2022-05-01 DIAGNOSIS — R1319 Other dysphagia: Secondary | ICD-10-CM

## 2022-05-01 NOTE — Progress Notes (Signed)
Modified Barium Swallow Progress Note  Patient Details  Name: Cathy Miranda MRN: [id number] Date of Birth: July 02, 1955  Today's Date: 05/01/2022  Modified Barium Swallow completed.  Full report located under Chart Review in the Imaging Section.  Brief recommendations include the following:  Clinical Impression  Pt presents with adequate oropharyngeal abilities when consuming thin liquids via cup and straw, puree and whole barium tablet with thin liquids. When consuming the barium tablet, pt reported "slow" movement of tablet pointing to mid-sternum with additional sip of thin liquids aiding in passage thru esophagus. Results of this study as well as video reviewed. All of pt's questions answered to her satisfaction.   Swallow Evaluation Recommendations   Recommended Consults: Consider GI evaluation   SLP Diet Recommendations: Regular solids;Thin liquid   Liquid Administration via: Cup;Straw   Medication Administration: Whole meds with liquid   Supervision: Patient able to self feed   Compensations: Minimize environmental distractions;Slow rate;Small sips/bites   Postural Changes: Remain semi-upright after after feeds/meals (Comment);Seated upright at 90 degrees   Oral Care Recommendations: Oral care BID       Izzak Fries B. Dreama Saa, M.S., CCC-SLP, CBIS Speech-Language Pathologist Certified Brain Injury Specialist [facility]  [facility] [phone] Ascom [phone] Fax [phone]  Desire Fulp Dreama Saa 05/01/2022,1:47 PM

## 2022-09-01 DIAGNOSIS — L400 Psoriasis vulgaris: Secondary | ICD-10-CM | POA: Diagnosis not present

## 2022-09-02 DIAGNOSIS — E042 Nontoxic multinodular goiter: Secondary | ICD-10-CM | POA: Diagnosis not present

## 2022-09-02 DIAGNOSIS — R7303 Prediabetes: Secondary | ICD-10-CM | POA: Diagnosis not present

## 2022-09-02 DIAGNOSIS — N1831 Chronic kidney disease, stage 3a: Secondary | ICD-10-CM | POA: Diagnosis not present

## 2022-09-02 DIAGNOSIS — E7800 Pure hypercholesterolemia, unspecified: Secondary | ICD-10-CM | POA: Diagnosis not present

## 2022-09-02 NOTE — Progress Notes (Deleted)
Office Visit Note  Patient: Cathy Miranda             Date of Birth: 09/08/1955           MRN: [id number]             PCP: Thana Ates, MD Referring: Thana Ates, MD Visit Date: 09/15/2022 Occupation: @  Subjective:  No chief complaint on file.   History of Present Illness: Cathy Miranda is a 67 y.o. female ***     Activities of Daily Living:  Patient reports morning stiffness for *** {minute/hour:19697}.   Patient {ACTIONS;DENIES/REPORTS:21021675::"Denies"} nocturnal pain.  Difficulty dressing/grooming: {ACTIONS;DENIES/REPORTS:21021675::"Denies"} Difficulty climbing stairs: {ACTIONS;DENIES/REPORTS:21021675::"Denies"} Difficulty getting out of chair: {ACTIONS;DENIES/REPORTS:21021675::"Denies"} Difficulty using hands for taps, buttons, cutlery, and/or writing: {ACTIONS;DENIES/REPORTS:21021675::"Denies"}  No Rheumatology ROS completed.   PMFS History:  Patient Active Problem List   Diagnosis Date Noted   Primary osteoarthritis of both hands 11/07/2021   Primary osteoarthritis of both knees 11/07/2021   Primary osteoarthritis of both feet 11/07/2021   DDD (degenerative disc disease), cervical 11/07/2021   DDD (degenerative disc disease), lumbar 11/07/2021   History of IBS 10/14/2021   Fatty liver 10/14/2021   History of diverticulosis 10/14/2021   Hypercholesteremia 10/14/2021   Essential hypertension 10/14/2021   Prediabetes 10/14/2021   Psoriasis 10/14/2021   Left arm pain 06/18/2021   Left arm weakness 06/18/2021   Dysesthesia 06/18/2021    Past Medical History:  Diagnosis Date   Cobalamin deficiency    Essential hypertension    Hypercholesteremia    Hyperlipemia    IBS (irritable colon syndrome)    Monoplegia affecting left nondominant side (HCC)    Psoriasis    Vitamin D deficiency     Family History  Problem Relation Age of Onset   Diabetes Mother    Lymphoma Mother    High blood pressure Mother    Pancreatic cancer Sister    High blood  pressure Brother    Healthy Daughter    Healthy Son    Past Surgical History:  Procedure Laterality Date   CESAREAN SECTION     total of 2   Social History   Social History Narrative   Lives with husband   Right handed   Caffeine: no soda, tea-couple a cups a week   Immunization History  Administered Date(s) Administered   Moderna Sars-Covid-2 Vaccination 07/15/2019, 08/12/2019     Objective: Vital Signs: There were no vitals taken for this visit.   Physical Exam   Musculoskeletal Exam: ***  CDAI Exam: CDAI Score: -- Patient Global: --; Provider Global: -- Swollen: --; Tender: -- Joint Exam 09/15/2022   No joint exam has been documented for this visit   There is currently no information documented on the homunculus. Go to the Rheumatology activity and complete the homunculus joint exam.  Investigation: No additional findings.  Imaging: No results found.  Recent Labs: No results found for: "WBC", "HGB", "PLT", "NA", "K", "CL", "CO2", "GLUCOSE", "BUN", "CREATININE", "BILITOT", "ALKPHOS", "AST", "ALT", "PROT", "ALBUMIN", "CALCIUM", "GFRAA", "QFTBGOLD", "QFTBGOLDPLUS"  Speciality Comments: Humira, Stelara, Tremfya-inadequate response  Procedures:  No procedures performed Allergies: Sulfa antibiotics   Assessment / Plan:     Visit Diagnoses: No diagnosis found.  Orders: No orders of the defined types were placed in this encounter.  No orders of the defined types were placed in this encounter.   Face-to-face time spent with patient was *** minutes. Greater than 50% of time was spent in counseling and coordination of care.  Follow-Up Instructions: No follow-ups on file.   Earnestine Mealing, CMA  Note - This record has been created using Editor, commissioning.  Chart creation errors have been sought, but may not always  have been located. Such creation errors do not reflect on  the standard of medical care.

## 2022-09-09 ENCOUNTER — Ambulatory Visit: Payer: Medicare HMO | Admitting: Gastroenterology

## 2022-09-09 ENCOUNTER — Encounter: Payer: Self-pay | Admitting: Gastroenterology

## 2022-09-09 ENCOUNTER — Other Ambulatory Visit: Payer: Self-pay

## 2022-09-09 VITALS — BP 138/86 | HR 73 | Temp 98.3°F | Ht 62.0 in | Wt 147.0 lb

## 2022-09-09 DIAGNOSIS — R09A2 Foreign body sensation, throat: Secondary | ICD-10-CM

## 2022-09-09 DIAGNOSIS — R1010 Upper abdominal pain, unspecified: Secondary | ICD-10-CM | POA: Diagnosis not present

## 2022-09-09 DIAGNOSIS — Z800 Family history of malignant neoplasm of digestive organs: Secondary | ICD-10-CM

## 2022-09-09 DIAGNOSIS — R634 Abnormal weight loss: Secondary | ICD-10-CM | POA: Diagnosis not present

## 2022-09-09 DIAGNOSIS — M25579 Pain in unspecified ankle and joints of unspecified foot: Secondary | ICD-10-CM | POA: Insufficient documentation

## 2022-09-09 DIAGNOSIS — Z1211 Encounter for screening for malignant neoplasm of colon: Secondary | ICD-10-CM

## 2022-09-09 MED ORDER — NA SULFATE-K SULFATE-MG SULF 17.5-3.13-1.6 GM/177ML PO SOLN: 354.0000 mL | Freq: Once | ORAL | 0 refills | Status: AC

## 2022-09-09 NOTE — Patient Instructions (Signed)
Got your CT scan schedule for you on 09/17/2022 [facility] medical mall at 2:15pm for a 2:30pm scan. If you need to reschedule please call [phone] option 3 and then option 2.

## 2022-09-09 NOTE — Progress Notes (Signed)
Arlyss Repress, MD [address]  [address]  [place], [place] [address]  Main: [phone]  Fax: [phone]    Gastroenterology Consultation  Referring Provider:     Thana Ates, MD Primary Care Physician:  Thana Ates, MD Primary Gastroenterologist:  Dr. Arlyss Repress Reason for Consultation: Globus sensation, upper abdominal discomfort        HPI:   Cathy Miranda is a 67 y.o. female referred by Dr. Thana Ates, MD  for consultation & management of more than 4-month history of globus sensation, she originally saw Dr. Jenne Campus for choking episodes with solids and liquids.  Laryngoscopy was normal.  Underwent swallow evaluation which was normal as well.  No evidence of oropharyngeal dysphagia.  Patient tried omeprazole 40 mg once a day which provided modest benefit.  Therefore, referred to GI for further evaluation.  Patient also reports irregular bowel habits for several years, tries to eat healthy, takes Colace or MiraLAX as needed.  She is also experiencing intermittent episodes of upper abdominal discomfort, sharp in nature associated with nausea and she states the symptoms are different from related to constipation. She was on Skyrizi for history of scoliosis which has been discontinued. She denies any rectal bleeding. Underwent colonoscopy 9 years ago for screening Sister with pancreatic cancer in her 43s  NSAIDs: None  Antiplts/Anticoagulants/Anti thrombotics: None  GI Procedures: Colonoscopy 9 years ago for colon cancer screening, reportedly normal  Past Medical History:  Diagnosis Date   Cobalamin deficiency    Essential hypertension    Hypercholesteremia    Hyperlipemia    IBS (irritable colon syndrome)    Monoplegia affecting left nondominant side    Psoriasis    Vitamin D deficiency     Past Surgical History:  Procedure Laterality Date   CESAREAN SECTION     total of 2     Current Outpatient Medications:    Calcium Carbonate-Vitamin D  500-3.125 MG-MCG TABS, Take 1 tablet by mouth every other day., Disp: , Rfl:    MULTIPLE VITAMINS-MINERALS PO, Take 1 tablet by mouth daily., Disp: , Rfl:    Na Sulfate-K Sulfate-Mg Sulf 17.5-3.13-1.6 GM/177ML SOLN, Take 354 mLs by mouth once for 1 dose., Disp: 354 mL, Rfl: 0   Roflumilast (ZORYVE) 0.3 % CREA, , Disp: , Rfl:    Turmeric (QC TUMERIC COMPLEX PO), Take by mouth daily., Disp: , Rfl:    omeprazole (PRILOSEC) 40 MG capsule, Take 40 mg by mouth daily. (Patient not taking: Reported on 09/09/2022), Disp: , Rfl:    Family History  Problem Relation Age of Onset   Diabetes Mother    Lymphoma Mother    High blood pressure Mother    Pancreatic cancer Sister    High blood pressure Brother    Healthy Daughter    Healthy Son      Social History   Tobacco Use   Smoking status: Never    Passive exposure: Never   Smokeless tobacco: Never  Vaping Use   Vaping Use: Never used  Substance Use Topics   Alcohol use: Never   Drug use: Never    Allergies as of 09/09/2022 - Review Complete 09/09/2022  Allergen Reaction Noted   Sulfa antibiotics Nausea Only and Other (See Comments) 08/20/2014    Review of Systems:    All systems reviewed and negative except where noted in HPI.   Physical Exam:  BP 138/86 (BP Location: Left Arm, Patient Position: Sitting, Cuff Size: Normal)  Pulse 73   Temp 98.3 F (36.8 C) (Oral)   Ht  (1.575 m)   Wt 147 lb (66.7 kg)   BMI 26.89 kg/m  No LMP recorded. Patient is postmenopausal.  General:   Alert,  Well-developed, well-nourished, pleasant and cooperative in NAD Head:  Normocephalic and atraumatic. Eyes:  Sclera clear, no icterus.   Conjunctiva pink. Ears:  Normal auditory acuity. Nose:  No deformity, discharge, or lesions. Mouth:  No deformity or lesions,oropharynx pink & moist. Neck:  Supple; no masses or thyromegaly. Lungs:  Respirations even and unlabored.  Clear throughout to auscultation.   No wheezes, crackles, or rhonchi. No  acute distress. Heart:  Regular rate and rhythm; no murmurs, clicks, rubs, or gallops. Abdomen:  Normal bowel sounds. Soft, non-tender and non-distended without masses, hepatosplenomegaly or hernias noted.  No guarding or rebound tenderness.   Rectal: Not performed Msk:  Symmetrical without gross deformities. Good, equal movement & strength bilaterally. Pulses:  Normal pulses noted. Extremities:  No clubbing or edema.  No cyanosis. Neurologic:  Alert and oriented x3;  grossly normal neurologically. Skin:  Intact without significant lesions or rashes. No jaundice. Psych:  Alert and cooperative. Normal mood and affect.  Imaging Studies: Reviewed  Assessment and Plan:   Smt Lokey is a 67 y.o. female with history of psoriasis, previously on Cristy Folks is seen in consultation for third visit history of upper abdominal discomfort, family history of pancreatic cancer in sister in 88s, globus sensation, chronic constipation  Recommend EGD with esophageal and gastric biopsies Recommend CT abdomen and pelvis pancreas protocol given upper abdominal discomfort, weight loss and family history of pancreatic cancer Recommend colonoscopy with 2-day prep for colon cancer screening   Follow up based on the above workup   Arlyss Repress, MD

## 2022-09-14 ENCOUNTER — Encounter: Payer: Self-pay | Admitting: Gastroenterology

## 2022-09-15 ENCOUNTER — Ambulatory Visit: Payer: Medicare PPO | Admitting: Rheumatology

## 2022-09-15 ENCOUNTER — Encounter: Payer: Self-pay | Admitting: Gastroenterology

## 2022-09-15 ENCOUNTER — Ambulatory Visit: Payer: Medicare HMO | Admitting: Certified Registered Nurse Anesthetist

## 2022-09-15 ENCOUNTER — Ambulatory Visit
Admission: RE | Admit: 2022-09-15 | Discharge: 2022-09-15 | Disposition: A | Discharge status: Home / Self Care | Payer: Medicare HMO | Attending: Gastroenterology | Admitting: Gastroenterology

## 2022-09-15 ENCOUNTER — Encounter
Admission: RE | Disposition: A | Discharge status: Home / Self Care | Payer: Self-pay | Source: Home / Self Care | Attending: Gastroenterology

## 2022-09-15 DIAGNOSIS — K209 Esophagitis, unspecified without bleeding: Secondary | ICD-10-CM | POA: Diagnosis not present

## 2022-09-15 DIAGNOSIS — M19041 Primary osteoarthritis, right hand: Secondary | ICD-10-CM

## 2022-09-15 DIAGNOSIS — K760 Fatty (change of) liver, not elsewhere classified: Secondary | ICD-10-CM

## 2022-09-15 DIAGNOSIS — L409 Psoriasis, unspecified: Secondary | ICD-10-CM

## 2022-09-15 DIAGNOSIS — E538 Deficiency of other specified B group vitamins: Secondary | ICD-10-CM

## 2022-09-15 DIAGNOSIS — I10 Essential (primary) hypertension: Secondary | ICD-10-CM | POA: Insufficient documentation

## 2022-09-15 DIAGNOSIS — K5730 Diverticulosis of large intestine without perforation or abscess without bleeding: Secondary | ICD-10-CM | POA: Diagnosis not present

## 2022-09-15 DIAGNOSIS — E559 Vitamin D deficiency, unspecified: Secondary | ICD-10-CM | POA: Diagnosis not present

## 2022-09-15 DIAGNOSIS — Z79899 Other long term (current) drug therapy: Secondary | ICD-10-CM

## 2022-09-15 DIAGNOSIS — E7800 Pure hypercholesterolemia, unspecified: Secondary | ICD-10-CM

## 2022-09-15 DIAGNOSIS — R1013 Epigastric pain: Secondary | ICD-10-CM | POA: Insufficient documentation

## 2022-09-15 DIAGNOSIS — R1310 Dysphagia, unspecified: Secondary | ICD-10-CM | POA: Diagnosis not present

## 2022-09-15 DIAGNOSIS — M2550 Pain in unspecified joint: Secondary | ICD-10-CM

## 2022-09-15 DIAGNOSIS — K2101 Gastro-esophageal reflux disease with esophagitis, with bleeding: Secondary | ICD-10-CM | POA: Insufficient documentation

## 2022-09-15 DIAGNOSIS — N1831 Chronic kidney disease, stage 3a: Secondary | ICD-10-CM

## 2022-09-15 DIAGNOSIS — E785 Hyperlipidemia, unspecified: Secondary | ICD-10-CM | POA: Insufficient documentation

## 2022-09-15 DIAGNOSIS — K589 Irritable bowel syndrome without diarrhea: Secondary | ICD-10-CM | POA: Diagnosis not present

## 2022-09-15 DIAGNOSIS — K5790 Diverticulosis of intestine, part unspecified, without perforation or abscess without bleeding: Secondary | ICD-10-CM | POA: Diagnosis not present

## 2022-09-15 DIAGNOSIS — K2210 Ulcer of esophagus without bleeding: Secondary | ICD-10-CM

## 2022-09-15 DIAGNOSIS — R09A2 Foreign body sensation, throat: Secondary | ICD-10-CM | POA: Diagnosis not present

## 2022-09-15 DIAGNOSIS — M7061 Trochanteric bursitis, right hip: Secondary | ICD-10-CM

## 2022-09-15 DIAGNOSIS — Z1211 Encounter for screening for malignant neoplasm of colon: Secondary | ICD-10-CM | POA: Diagnosis not present

## 2022-09-15 DIAGNOSIS — G8334 Monoplegia, unspecified affecting left nondominant side: Secondary | ICD-10-CM | POA: Insufficient documentation

## 2022-09-15 DIAGNOSIS — M19071 Primary osteoarthritis, right ankle and foot: Secondary | ICD-10-CM

## 2022-09-15 DIAGNOSIS — M5136 Other intervertebral disc degeneration, lumbar region: Secondary | ICD-10-CM

## 2022-09-15 DIAGNOSIS — M170 Bilateral primary osteoarthritis of knee: Secondary | ICD-10-CM

## 2022-09-15 DIAGNOSIS — K222 Esophageal obstruction: Secondary | ICD-10-CM | POA: Diagnosis not present

## 2022-09-15 DIAGNOSIS — M5030 Other cervical disc degeneration, unspecified cervical region: Secondary | ICD-10-CM

## 2022-09-15 DIAGNOSIS — Z8719 Personal history of other diseases of the digestive system: Secondary | ICD-10-CM

## 2022-09-15 DIAGNOSIS — R7303 Prediabetes: Secondary | ICD-10-CM

## 2022-09-15 HISTORY — PX: COLONOSCOPY WITH PROPOFOL: SHX5780

## 2022-09-15 HISTORY — PX: ESOPHAGOGASTRODUODENOSCOPY (EGD) WITH PROPOFOL: SHX5813

## 2022-09-15 SURGERY — COLONOSCOPY WITH PROPOFOL
Anesthesia: General

## 2022-09-15 MED ORDER — PROPOFOL 10 MG/ML IV BOLUS
INTRAVENOUS | Status: DC | PRN
  Administered 2022-09-15: 80 mg via INTRAVENOUS

## 2022-09-15 MED ORDER — LIDOCAINE HCL (CARDIAC) PF 100 MG/5ML IV SOSY
PREFILLED_SYRINGE | INTRAVENOUS | Status: DC | PRN
  Administered 2022-09-15: 50 mg via INTRAVENOUS

## 2022-09-15 MED ORDER — PROPOFOL 500 MG/50ML IV EMUL
INTRAVENOUS | Status: DC | PRN
  Administered 2022-09-15: 150 ug/kg/min via INTRAVENOUS

## 2022-09-15 MED ORDER — OMEPRAZOLE 40 MG PO CPDR: 40.0000 mg | DELAYED_RELEASE_CAPSULE | Freq: Every day | ORAL | 2 refills | Status: AC

## 2022-09-15 MED ORDER — SODIUM CHLORIDE 0.9 % IV SOLN: INTRAVENOUS | Status: DC

## 2022-09-15 NOTE — Anesthesia Postprocedure Evaluation (Signed)
Anesthesia Post Note  Patient: Cathy Miranda  Procedure(s) Performed: COLONOSCOPY WITH PROPOFOL ESOPHAGOGASTRODUODENOSCOPY (EGD) WITH PROPOFOL  Patient location during evaluation: Endoscopy Anesthesia Type: General Level of consciousness: awake and alert Pain management: pain level controlled Vital Signs Assessment: post-procedure vital signs reviewed and stable Respiratory status: spontaneous breathing, nonlabored ventilation, respiratory function stable and patient connected to nasal cannula oxygen Cardiovascular status: blood pressure returned to baseline and stable Postop Assessment: no apparent nausea or vomiting Anesthetic complications: no   No notable events documented.   Last Vitals:  Vitals:   09/15/22 1031 09/15/22 1133  BP: 125/87 110/60  Pulse: 71 65  Resp: 16 16  Temp: (!) 35.9 C (!) 35.8 C  SpO2: 100% 100%    Last Pain:  Vitals:   09/15/22 1143  TempSrc:   PainSc: 0-No pain                 Corinda Gubler

## 2022-09-15 NOTE — Anesthesia Preprocedure Evaluation (Signed)
Anesthesia Evaluation  Patient identified by MRN, date of birth, ID band Patient awake    Reviewed: Allergy & Precautions, NPO status , Patient's Chart, lab work & pertinent test results  History of Anesthesia Complications Negative for: history of anesthetic complications  Airway Mallampati: II  TM Distance: >3 FB Neck ROM: Full    Dental no notable dental hx. (+) Teeth Intact   Pulmonary neg pulmonary ROS, neg sleep apnea, neg COPD, Patient abstained from smoking.Not current smoker   Pulmonary exam normal breath sounds clear to auscultation       Cardiovascular Exercise Tolerance: Good METShypertension, Pt. on medications (-) CAD and (-) Past MI (-) dysrhythmias  Rhythm:Regular Rate:Normal - Systolic murmurs    Neuro/Psych negative neurological ROS  negative psych ROS   GI/Hepatic ,neg GERD  ,,(+)     (-) substance abuse    Endo/Other  neg diabetes    Renal/GU negative Renal ROS     Musculoskeletal   Abdominal   Peds  Hematology   Anesthesia Other Findings Past Medical History: No date: Cobalamin deficiency No date: Essential hypertension No date: Hypercholesteremia No date: Hyperlipemia No date: IBS (irritable colon syndrome) No date: Monoplegia affecting left nondominant side (HCC) No date: Psoriasis No date: Vitamin D deficiency  Reproductive/Obstetrics                             Anesthesia Physical Anesthesia Plan  ASA: 2  Anesthesia Plan: General   Post-op Pain Management: Minimal or no pain anticipated   Induction: Intravenous  PONV Risk Score and Plan: 3 and Propofol infusion, TIVA and Ondansetron  Airway Management Planned: Nasal Cannula  Additional Equipment: None  Intra-op Plan:   Post-operative Plan:   Informed Consent: I have reviewed the patients History and Physical, chart, labs and discussed the procedure including the risks, benefits and  alternatives for the proposed anesthesia with the patient or authorized representative who has indicated his/her understanding and acceptance.     Dental advisory given  Plan Discussed with: CRNA and Surgeon  Anesthesia Plan Comments: (Discussed risks of anesthesia with patient, including possibility of difficulty with spontaneous ventilation under anesthesia necessitating airway intervention, PONV, and rare risks such as cardiac or respiratory or neurological events, and allergic reactions. Discussed the role of CRNA in patient's perioperative care. Patient understands.)       Anesthesia Quick Evaluation

## 2022-09-15 NOTE — Anesthesia Procedure Notes (Signed)
Date/Time: 09/15/2022 10:55 AM  Performed by: Ginger Carne, CRNAPre-anesthesia Checklist: Patient identified, Emergency Drugs available, Suction available, Patient being monitored and Timeout performed Patient Re-evaluated:Patient Re-evaluated prior to induction Oxygen Delivery Method: Nasal cannula Preoxygenation: Pre-oxygenation with 100% oxygen Induction Type: IV induction

## 2022-09-15 NOTE — Transfer of Care (Signed)
Immediate Anesthesia Transfer of Care Note  Patient: Cathy Miranda  Procedure(s) Performed: COLONOSCOPY WITH PROPOFOL ESOPHAGOGASTRODUODENOSCOPY (EGD) WITH PROPOFOL  Patient Location: Endoscopy Unit  Anesthesia Type:General  Level of Consciousness: sedated  Airway & Oxygen Therapy: Patient Spontanous Breathing  Post-op Assessment: Report given to RN and Post -op Vital signs reviewed and stable  Post vital signs: Reviewed and stable  Last Vitals:  Vitals Value Taken Time  BP 110/60 09/15/22 1133  Temp 35.8 C 09/15/22 1133  Pulse 65 09/15/22 1133  Resp 16 09/15/22 1133  SpO2 100 % 09/15/22 1133    Last Pain:  Vitals:   09/15/22 1133  TempSrc: Temporal  PainSc: Asleep         Complications: No notable events documented.

## 2022-09-15 NOTE — Op Note (Signed)
[facility] Gastroenterology Patient Name: Cathy Miranda Procedure Date: 09/15/2022 10:44 AM MRN: [id number] Account #: [id number] Date of Birth: [birth year] Admit Type: Outpatient Age: 67 Room: [facility] ENDO ROOM 3 Gender: Female Note Status: Finalized Instrument Name: Upper Endoscope [id number] Procedure:             Upper GI endoscopy Indications:           Epigastric abdominal pain, Dysphagia Providers:             Toney Reil MD, MD Referring MD:          Toney Reil MD, MD (Referring MD), Sneha P.                         Margaretann Loveless (Referring MD) Medicines:             General Anesthesia Complications:         No immediate complications. Estimated blood loss: None. Procedure:             Pre-Anesthesia Assessment:                        - Prior to the procedure, a History and Physical was                         performed, and patient medications and allergies were                         reviewed. The patient is competent. The risks and                         benefits of the procedure and the sedation options and                         risks were discussed with the patient. All questions                         were answered and informed consent was obtained.                         Patient identification and proposed procedure were                         verified by the physician, the nurse, the                         anesthesiologist, the anesthetist and the technician                         in the pre-procedure area in the procedure room in the                         endoscopy suite. Mental Status Examination: alert and                         oriented. Airway Examination: normal oropharyngeal                         airway and neck mobility. Respiratory Examination:  clear to auscultation. CV Examination: normal.                         Prophylactic Antibiotics: The patient does not require                          prophylactic antibiotics. Prior Anticoagulants: The                         patient has taken no anticoagulant or antiplatelet                         agents. ASA Grade Assessment: II - A patient with mild                         systemic disease. After reviewing the risks and                         benefits, the patient was deemed in satisfactory                         condition to undergo the procedure. The anesthesia                         plan was to use general anesthesia. Immediately prior                         to administration of medications, the patient was                         re-assessed for adequacy to receive sedatives. The                         heart rate, respiratory rate, oxygen saturations,                         blood pressure, adequacy of pulmonary ventilation, and                         response to care were monitored throughout the                         procedure. The physical status of the patient was                         re-assessed after the procedure.                        After obtaining informed consent, the endoscope was                         passed under direct vision. Throughout the procedure,                         the patient's blood pressure, pulse, and oxygen                         saturations were monitored continuously. The Endoscope  was introduced through the mouth, and advanced to the                         second part of duodenum. The upper GI endoscopy was                         accomplished without difficulty. The patient tolerated                         the procedure well. Findings:      The duodenal bulb and second portion of the duodenum were normal.      The entire examined stomach was normal. Biopsies were taken with a cold       forceps for histology.      The cardia and gastric fundus were normal on retroflexion.      LA Grade A (one or more mucosal breaks less than 5 mm, not extending        between tops of 2 mucosal folds) esophagitis with bleeding was found at       the gastroesophageal junction.      The examined esophagus was normal. Biopsies were taken with a cold       forceps for histology.      One possible benign-appearing, intrinsic mild stenosis was found 38 cm       from the incisors. The stenosis was traversed. Given h/o dysphagia, A       TTS dilator was passed through the scope. Dilation with a 02-26-11 mm       balloon and a 12-13.5-15 mm balloon dilator was performed to 15 mm. Impression:            - Normal duodenal bulb and second portion of the                         duodenum.                        - Normal stomach. Biopsied.                        - LA Grade A reflux esophagitis with bleeding.                        - Normal esophagus. Biopsied.                        - Benign-appearing esophageal stenosis. Dilated. Recommendation:        - Follow an antireflux regimen.                        - Use a proton pump inhibitor PO daily for 3 months.                        - Proceed with colonoscopy as scheduled                        See colonoscopy report Procedure Code(s):     --- Professional ---                        [phone], Esophagogastroduodenoscopy, flexible,  transoral; with transendoscopic balloon dilation of                         esophagus (less than 30 mm diameter)                        43239, 59, Esophagogastroduodenoscopy, flexible,                         transoral; with biopsy, single or multiple Diagnosis Code(s):     --- Professional ---                        K21.01, Gastro-esophageal reflux disease with                         esophagitis, with bleeding                        K22.2, Esophageal obstruction                        R10.13, Epigastric pain                        R13.10, Dysphagia, unspecified CPT copyright 2022 American Medical Association. All rights reserved. The codes documented in this report  are preliminary and upon coder review may  be revised to meet current compliance requirements. Dr. Libby Maw Toney Reil MD, MD 09/15/2022 11:15:25 AM This report has been signed electronically. Number of Addenda: 0 Note Initiated On: 09/15/2022 10:44 AM Estimated Blood Loss:  Estimated blood loss: none.      [facility]

## 2022-09-15 NOTE — H&P (Signed)
Arlyss Repress, MD [address]  [address]  [place], [place] [address]  Main: [phone]  Fax: [phone] Pager: [phone]  Primary Care Physician:  Thana Ates, MD Primary Gastroenterologist:  Dr. Arlyss Repress  Pre-Procedure History & Physical: HPI:  Cathy Miranda is a 67 y.o. female is here for an endoscopy and colonoscopy.   Past Medical History:  Diagnosis Date   Cobalamin deficiency    Essential hypertension    Hypercholesteremia    Hyperlipemia    IBS (irritable colon syndrome)    Monoplegia affecting left nondominant side (HCC)    Psoriasis    Vitamin D deficiency     Past Surgical History:  Procedure Laterality Date   CESAREAN SECTION     total of 2    Prior to Admission medications   Medication Sig Start Date End Date Taking? Authorizing Provider  Calcium Carbonate-Vitamin D 500-3.125 MG-MCG TABS Take 1 tablet by mouth every other day.    [provider]  MULTIPLE VITAMINS-MINERALS PO Take 1 tablet by mouth daily.    [provider]  omeprazole (PRILOSEC) 40 MG capsule Take 40 mg by mouth daily. Patient not taking: Reported on 09/09/2022 04/22/22   [provider]  Roflumilast (ZORYVE) 0.3 % CREA  09/01/22   [provider]  Turmeric (QC TUMERIC COMPLEX PO) Take by mouth daily.    [provider]    Allergies as of 09/09/2022 - Review Complete 09/09/2022  Allergen Reaction Noted   Sulfa antibiotics Nausea Only and Other (See Comments) 08/20/2014    Family History  Problem Relation Age of Onset   Diabetes Mother    Lymphoma Mother    High blood pressure Mother    Pancreatic cancer Sister    High blood pressure Brother    Healthy Daughter    Healthy Son     Social History   Socioeconomic History   Marital status: Married    Spouse name: Ethelene Browns   Number of children: 2   Years of education: Not on file   Highest education level: Master's degree (e.g., MA, MS, MEng, MEd, MSW, MBA)   Occupational History   Not on file  Tobacco Use   Smoking status: Never    Passive exposure: Never   Smokeless tobacco: Never  Vaping Use   Vaping Use: Never used  Substance and Sexual Activity   Alcohol use: Never   Drug use: Never   Sexual activity: Not on file  Other Topics Concern   Not on file  Social History Narrative   Lives with husband   Right handed   Caffeine: no soda, tea-couple a cups a week   Social Determinants of Health   Financial Resource Strain: Not on file  Food Insecurity: Not on file  Transportation Needs: Not on file  Physical Activity: Not on file  Stress: Not on file  Social Connections: Not on file  Intimate Partner Violence: Not on file    Review of Systems: See HPI, otherwise negative ROS  Physical Exam: BP 125/87   Pulse 71   Temp (!) 96.6 F (35.9 C) (Temporal)   Resp 16   Wt 64.8 kg   SpO2 100%   BMI 26.12 kg/m  General:   Alert,  pleasant and cooperative in NAD Head:  Normocephalic and atraumatic. Neck:  Supple; no masses or thyromegaly. Lungs:  Clear throughout to auscultation.    Heart:  Regular rate and rhythm. Abdomen:  Soft, nontender and nondistended. Normal bowel  sounds, without guarding, and without rebound.   Neurologic:  Alert and  oriented x4;  grossly normal neurologically.  Impression/Plan: Cathy Miranda is here for an endoscopy and colonoscopy to be performed for upper abdominal discomfort, globus sensation, colon cancer screening  Risks, benefits, limitations, and alternatives regarding  endoscopy and colonoscopy have been reviewed with the patient.  Questions have been answered.  All parties agreeable.   Lannette Donath, MD  09/15/2022, 10:44 AM

## 2022-09-15 NOTE — Op Note (Signed)
[facility] Gastroenterology Patient Name: Cathy Miranda Procedure Date: 09/15/2022 10:43 AM MRN: [id number] Account #: [id number] Date of Birth: 1956/01/04 Admit Type: Outpatient Age: 67 Room: [facility] ENDO ROOM 3 Gender: Female Note Status: Finalized Instrument Name: Prentice Docker [id number] Procedure:             Colonoscopy Indications:           Screening for colorectal malignant neoplasm, This is                         the patient's first colonoscopy Providers:             Toney Reil MD, MD Referring MD:          Toney Reil MD, MD (Referring MD), Sneha P.                         Margaretann Loveless (Referring MD) Medicines:             General Anesthesia Complications:         No immediate complications. Estimated blood loss: None. Procedure:             Pre-Anesthesia Assessment:                        - Prior to the procedure, a History and Physical was                         performed, and patient medications and allergies were                         reviewed. The patient is competent. The risks and                         benefits of the procedure and the sedation options and                         risks were discussed with the patient. All questions                         were answered and informed consent was obtained.                         Patient identification and proposed procedure were                         verified by the physician, the nurse, the                         anesthesiologist, the anesthetist and the technician                         in the pre-procedure area in the procedure room in the                         endoscopy suite. Mental Status Examination: alert and                         oriented. Airway Examination: normal oropharyngeal  airway and neck mobility. Respiratory Examination:                         clear to auscultation. CV Examination: RRR, no                         murmurs, no S3 or S4.  Prophylactic Antibiotics: The                         patient does not require prophylactic antibiotics.                         Prior Anticoagulants: The patient has taken no                         anticoagulant or antiplatelet agents. ASA Grade                         Assessment: II - A patient with mild systemic disease.                         After reviewing the risks and benefits, the patient                         was deemed in satisfactory condition to undergo the                         procedure. The anesthesia plan was to use general                         anesthesia. Immediately prior to administration of                         medications, the patient was re-assessed for adequacy                         to receive sedatives. The heart rate, respiratory                         rate, oxygen saturations, blood pressure, adequacy of                         pulmonary ventilation, and response to care were                         monitored throughout the procedure. The physical                         status of the patient was re-assessed after the                         procedure.                        After obtaining informed consent, the colonoscope was                         passed under direct vision. Throughout the procedure,  the patient's blood pressure, pulse, and oxygen                         saturations were monitored continuously. The                         Colonoscope was introduced through the anus and                         advanced to the the cecum, identified by appendiceal                         orifice and ileocecal valve. The colonoscopy was                         performed without difficulty. The patient tolerated                         the procedure well. The quality of the bowel                         preparation was evaluated using the BBPS [facility] Bowel                         Preparation Scale) with scores of: Right Colon  = 3,                         Transverse Colon = 3 and Left Colon = 3 (entire mucosa                         seen well with no residual staining, small fragments                         of stool or opaque liquid). The total BBPS score                         equals 9. The ileocecal valve, appendiceal orifice,                         and rectum were photographed. Findings:      The perianal and digital rectal examinations were normal. Pertinent       negatives include normal sphincter tone and no palpable rectal lesions.      Multiple small-mouthed diverticula were found in the sigmoid colon.      The retroflexed view of the distal rectum and anal verge was normal and       showed no anal or rectal abnormalities.      The exam was otherwise without abnormality. Impression:            - Diverticulosis in the sigmoid colon.                        - The distal rectum and anal verge are normal on                         retroflexion view.                        -  The examination was otherwise normal.                        - No specimens collected. Recommendation:        - Discharge patient to home (with escort).                        - Resume previous diet today.                        - Continue present medications.                        - Repeat colonoscopy in 10 years for screening                         purposes. Procedure Code(s):     --- Professional ---                        Z6109, Colorectal cancer screening; colonoscopy on                         individual not meeting criteria for high risk Diagnosis Code(s):     --- Professional ---                        Z12.11, Encounter for screening for malignant neoplasm                         of colon                        K57.30, Diverticulosis of large intestine without                         perforation or abscess without bleeding CPT copyright 2022 American Medical Association. All rights reserved. The codes documented in this  report are preliminary and upon coder review may  be revised to meet current compliance requirements. Dr. Libby Maw Toney Reil MD, MD 09/15/2022 11:30:41 AM This report has been signed electronically. Number of Addenda: 0 Note Initiated On: 09/15/2022 10:43 AM Scope Withdrawal Time: 0 hours 6 minutes 16 seconds  Total Procedure Duration: 0 hours 11 minutes 54 seconds  Estimated Blood Loss:  Estimated blood loss: none. Estimated blood loss: none.      [facility]

## 2022-09-16 ENCOUNTER — Encounter: Payer: Self-pay | Admitting: Gastroenterology

## 2022-09-16 LAB — SURGICAL PATHOLOGY

## 2022-09-17 ENCOUNTER — Telehealth: Payer: Self-pay

## 2022-09-17 ENCOUNTER — Ambulatory Visit
Admission: RE | Admit: 2022-09-17 | Discharge: 2022-09-17 | Disposition: A | Discharge status: Home / Self Care | Payer: Medicare HMO | Source: Ambulatory Visit | Attending: Gastroenterology | Admitting: Gastroenterology

## 2022-09-17 DIAGNOSIS — Z800 Family history of malignant neoplasm of digestive organs: Secondary | ICD-10-CM | POA: Insufficient documentation

## 2022-09-17 DIAGNOSIS — R634 Abnormal weight loss: Secondary | ICD-10-CM | POA: Diagnosis not present

## 2022-09-17 DIAGNOSIS — R1010 Upper abdominal pain, unspecified: Secondary | ICD-10-CM | POA: Diagnosis not present

## 2022-09-17 DIAGNOSIS — R109 Unspecified abdominal pain: Secondary | ICD-10-CM | POA: Diagnosis not present

## 2022-09-17 HISTORY — DX: Disorder of kidney and ureter, unspecified: N28.9

## 2022-09-17 LAB — POCT I-STAT CREATININE: Creatinine, Ser: 1.2 mg/dL — ABNORMAL HIGH (ref 0.44–1.00)

## 2022-09-17 MED ORDER — IOHEXOL 300 MG/ML  SOLN
100.0000 mL | Freq: Once | INTRAMUSCULAR | Status: AC | PRN
  Administered 2022-09-17: 100 mL via INTRAVENOUS

## 2022-09-17 NOTE — Telephone Encounter (Signed)
Called and left a message for call back  

## 2022-09-17 NOTE — Telephone Encounter (Signed)
Patient verbalized understanding of results  

## 2022-09-17 NOTE — Telephone Encounter (Signed)
-----   Message from Toney Reil, MD sent at 09/17/2022  2:02 PM EDT ----- Please inform patient that the pathology results from upper endoscopy came back normal.  On endoscopy, she does have erosive esophagitis which is inflammation in her esophagus from reflux causing her symptoms.  She should continue taking Prilosec 40 mg once a day before breakfast for 3 months and continue antireflux lifestyle  Rohini Vanga

## 2022-11-11 DIAGNOSIS — H6121 Impacted cerumen, right ear: Secondary | ICD-10-CM | POA: Diagnosis not present

## 2022-11-11 DIAGNOSIS — H6993 Unspecified Eustachian tube disorder, bilateral: Secondary | ICD-10-CM | POA: Diagnosis not present

## 2022-11-19 DIAGNOSIS — J0100 Acute maxillary sinusitis, unspecified: Secondary | ICD-10-CM | POA: Diagnosis not present

## 2022-11-19 DIAGNOSIS — H6123 Impacted cerumen, bilateral: Secondary | ICD-10-CM | POA: Diagnosis not present

## 2022-12-01 ENCOUNTER — Other Ambulatory Visit: Payer: Self-pay | Admitting: Gastroenterology

## 2022-12-02 DIAGNOSIS — Z7189 Other specified counseling: Secondary | ICD-10-CM | POA: Diagnosis not present

## 2022-12-02 DIAGNOSIS — D235 Other benign neoplasm of skin of trunk: Secondary | ICD-10-CM | POA: Diagnosis not present

## 2022-12-02 DIAGNOSIS — L400 Psoriasis vulgaris: Secondary | ICD-10-CM | POA: Diagnosis not present

## 2022-12-07 DIAGNOSIS — Z1231 Encounter for screening mammogram for malignant neoplasm of breast: Secondary | ICD-10-CM | POA: Diagnosis not present

## 2023-01-07 DIAGNOSIS — N390 Urinary tract infection, site not specified: Secondary | ICD-10-CM | POA: Diagnosis not present

## 2023-01-07 DIAGNOSIS — R350 Frequency of micturition: Secondary | ICD-10-CM | POA: Diagnosis not present

## 2023-03-08 DIAGNOSIS — Z0000 Encounter for general adult medical examination without abnormal findings: Secondary | ICD-10-CM | POA: Diagnosis not present

## 2023-03-08 DIAGNOSIS — R7303 Prediabetes: Secondary | ICD-10-CM | POA: Diagnosis not present

## 2023-03-08 DIAGNOSIS — E559 Vitamin D deficiency, unspecified: Secondary | ICD-10-CM | POA: Diagnosis not present

## 2023-03-08 DIAGNOSIS — E042 Nontoxic multinodular goiter: Secondary | ICD-10-CM | POA: Diagnosis not present

## 2023-03-08 DIAGNOSIS — L409 Psoriasis, unspecified: Secondary | ICD-10-CM | POA: Diagnosis not present

## 2023-03-08 DIAGNOSIS — K760 Fatty (change of) liver, not elsewhere classified: Secondary | ICD-10-CM | POA: Diagnosis not present

## 2023-03-08 DIAGNOSIS — Z1331 Encounter for screening for depression: Secondary | ICD-10-CM | POA: Diagnosis not present

## 2023-03-08 DIAGNOSIS — M8589 Other specified disorders of bone density and structure, multiple sites: Secondary | ICD-10-CM | POA: Diagnosis not present

## 2023-03-08 DIAGNOSIS — N1831 Chronic kidney disease, stage 3a: Secondary | ICD-10-CM | POA: Diagnosis not present

## 2023-03-08 DIAGNOSIS — E7800 Pure hypercholesterolemia, unspecified: Secondary | ICD-10-CM | POA: Diagnosis not present

## 2023-03-08 DIAGNOSIS — E538 Deficiency of other specified B group vitamins: Secondary | ICD-10-CM | POA: Diagnosis not present

## 2023-03-17 DIAGNOSIS — L400 Psoriasis vulgaris: Secondary | ICD-10-CM | POA: Diagnosis not present

## 2023-03-17 DIAGNOSIS — Z79899 Other long term (current) drug therapy: Secondary | ICD-10-CM | POA: Diagnosis not present

## 2023-03-17 DIAGNOSIS — L6681 Central centrifugal cicatricial alopecia: Secondary | ICD-10-CM | POA: Diagnosis not present

## 2023-04-06 DIAGNOSIS — H52223 Regular astigmatism, bilateral: Secondary | ICD-10-CM | POA: Diagnosis not present

## 2023-04-06 DIAGNOSIS — H35362 Drusen (degenerative) of macula, left eye: Secondary | ICD-10-CM | POA: Diagnosis not present

## 2023-04-06 DIAGNOSIS — H11003 Unspecified pterygium of eye, bilateral: Secondary | ICD-10-CM | POA: Diagnosis not present

## 2023-04-06 DIAGNOSIS — H11002 Unspecified pterygium of left eye: Secondary | ICD-10-CM | POA: Diagnosis not present

## 2023-04-06 DIAGNOSIS — H11001 Unspecified pterygium of right eye: Secondary | ICD-10-CM | POA: Diagnosis not present

## 2023-04-06 DIAGNOSIS — H2513 Age-related nuclear cataract, bilateral: Secondary | ICD-10-CM | POA: Diagnosis not present

## 2023-04-21 DIAGNOSIS — H52223 Regular astigmatism, bilateral: Secondary | ICD-10-CM | POA: Diagnosis not present

## 2023-04-21 DIAGNOSIS — H5203 Hypermetropia, bilateral: Secondary | ICD-10-CM | POA: Diagnosis not present

## 2023-05-05 DIAGNOSIS — H6502 Acute serous otitis media, left ear: Secondary | ICD-10-CM | POA: Diagnosis not present

## 2023-05-05 DIAGNOSIS — H6121 Impacted cerumen, right ear: Secondary | ICD-10-CM | POA: Diagnosis not present

## 2023-05-05 DIAGNOSIS — J329 Chronic sinusitis, unspecified: Secondary | ICD-10-CM | POA: Diagnosis not present

## 2023-07-19 DIAGNOSIS — L400 Psoriasis vulgaris: Secondary | ICD-10-CM | POA: Diagnosis not present

## 2023-08-05 DIAGNOSIS — G5601 Carpal tunnel syndrome, right upper limb: Secondary | ICD-10-CM | POA: Diagnosis not present

## 2023-08-05 DIAGNOSIS — M25572 Pain in left ankle and joints of left foot: Secondary | ICD-10-CM | POA: Diagnosis not present

## 2023-08-16 DIAGNOSIS — L400 Psoriasis vulgaris: Secondary | ICD-10-CM | POA: Diagnosis not present

## 2023-08-17 DIAGNOSIS — D235 Other benign neoplasm of skin of trunk: Secondary | ICD-10-CM | POA: Diagnosis not present

## 2023-08-17 DIAGNOSIS — Z7189 Other specified counseling: Secondary | ICD-10-CM | POA: Diagnosis not present

## 2023-08-17 DIAGNOSIS — L821 Other seborrheic keratosis: Secondary | ICD-10-CM | POA: Diagnosis not present

## 2023-08-17 DIAGNOSIS — L400 Psoriasis vulgaris: Secondary | ICD-10-CM | POA: Diagnosis not present

## 2023-09-08 DIAGNOSIS — K219 Gastro-esophageal reflux disease without esophagitis: Secondary | ICD-10-CM | POA: Diagnosis not present

## 2023-09-08 DIAGNOSIS — J309 Allergic rhinitis, unspecified: Secondary | ICD-10-CM | POA: Diagnosis not present

## 2023-09-08 DIAGNOSIS — L409 Psoriasis, unspecified: Secondary | ICD-10-CM | POA: Diagnosis not present

## 2023-09-08 DIAGNOSIS — R591 Generalized enlarged lymph nodes: Secondary | ICD-10-CM | POA: Diagnosis not present

## 2023-09-16 DIAGNOSIS — M25572 Pain in left ankle and joints of left foot: Secondary | ICD-10-CM | POA: Diagnosis not present

## 2023-09-16 DIAGNOSIS — S93492A Sprain of other ligament of left ankle, initial encounter: Secondary | ICD-10-CM | POA: Diagnosis not present

## 2023-10-15 DIAGNOSIS — K219 Gastro-esophageal reflux disease without esophagitis: Secondary | ICD-10-CM | POA: Diagnosis not present

## 2023-11-08 DIAGNOSIS — L400 Psoriasis vulgaris: Secondary | ICD-10-CM | POA: Diagnosis not present

## 2023-12-13 DIAGNOSIS — Z1231 Encounter for screening mammogram for malignant neoplasm of breast: Secondary | ICD-10-CM | POA: Diagnosis not present

## 2024-01-21 DIAGNOSIS — R42 Dizziness and giddiness: Secondary | ICD-10-CM | POA: Diagnosis not present

## 2024-01-21 DIAGNOSIS — J019 Acute sinusitis, unspecified: Secondary | ICD-10-CM | POA: Diagnosis not present

## 2024-01-21 DIAGNOSIS — H9202 Otalgia, left ear: Secondary | ICD-10-CM | POA: Diagnosis not present

## 2024-01-21 DIAGNOSIS — M542 Cervicalgia: Secondary | ICD-10-CM | POA: Diagnosis not present

## 2024-01-21 DIAGNOSIS — R200 Anesthesia of skin: Secondary | ICD-10-CM | POA: Diagnosis not present

## 2024-01-21 DIAGNOSIS — R0981 Nasal congestion: Secondary | ICD-10-CM | POA: Diagnosis not present

## 2024-01-31 DIAGNOSIS — L400 Psoriasis vulgaris: Secondary | ICD-10-CM | POA: Diagnosis not present

## 2024-02-21 DIAGNOSIS — Z79899 Other long term (current) drug therapy: Secondary | ICD-10-CM | POA: Diagnosis not present

## 2024-02-21 DIAGNOSIS — L400 Psoriasis vulgaris: Secondary | ICD-10-CM | POA: Diagnosis not present

## 2024-03-09 DIAGNOSIS — M50123 Cervical disc disorder at C6-C7 level with radiculopathy: Secondary | ICD-10-CM | POA: Diagnosis not present

## 2024-03-09 DIAGNOSIS — M25522 Pain in left elbow: Secondary | ICD-10-CM | POA: Diagnosis not present

## 2024-03-09 DIAGNOSIS — M5412 Radiculopathy, cervical region: Secondary | ICD-10-CM | POA: Diagnosis not present

## 2024-03-09 DIAGNOSIS — M50122 Cervical disc disorder at C5-C6 level with radiculopathy: Secondary | ICD-10-CM | POA: Diagnosis not present

## 2024-03-13 DIAGNOSIS — E538 Deficiency of other specified B group vitamins: Secondary | ICD-10-CM | POA: Diagnosis not present

## 2024-03-13 DIAGNOSIS — E559 Vitamin D deficiency, unspecified: Secondary | ICD-10-CM | POA: Diagnosis not present

## 2024-03-13 DIAGNOSIS — L409 Psoriasis, unspecified: Secondary | ICD-10-CM | POA: Diagnosis not present

## 2024-03-13 DIAGNOSIS — N1831 Chronic kidney disease, stage 3a: Secondary | ICD-10-CM | POA: Diagnosis not present

## 2024-03-13 DIAGNOSIS — K219 Gastro-esophageal reflux disease without esophagitis: Secondary | ICD-10-CM | POA: Diagnosis not present

## 2024-03-13 DIAGNOSIS — R030 Elevated blood-pressure reading, without diagnosis of hypertension: Secondary | ICD-10-CM | POA: Diagnosis not present

## 2024-03-13 DIAGNOSIS — Z0000 Encounter for general adult medical examination without abnormal findings: Secondary | ICD-10-CM | POA: Diagnosis not present

## 2024-03-13 DIAGNOSIS — M8589 Other specified disorders of bone density and structure, multiple sites: Secondary | ICD-10-CM | POA: Diagnosis not present

## 2024-03-13 DIAGNOSIS — R7303 Prediabetes: Secondary | ICD-10-CM | POA: Diagnosis not present

## 2024-03-13 DIAGNOSIS — E7800 Pure hypercholesterolemia, unspecified: Secondary | ICD-10-CM | POA: Diagnosis not present

## 2024-03-13 DIAGNOSIS — E042 Nontoxic multinodular goiter: Secondary | ICD-10-CM | POA: Diagnosis not present
